# Patient Record
Sex: Male | Born: 1955 | Smoking: Former smoker
Health system: Southern US, Community
[De-identification: ages and names within clinical notes are randomized; demographics above are authoritative.]

## PROBLEM LIST (undated history)

## (undated) DIAGNOSIS — J4520 Mild intermittent asthma, uncomplicated: Secondary | ICD-10-CM

## (undated) DIAGNOSIS — M1990 Unspecified osteoarthritis, unspecified site: Secondary | ICD-10-CM

## (undated) DIAGNOSIS — I2510 Atherosclerotic heart disease of native coronary artery without angina pectoris: Secondary | ICD-10-CM

## (undated) DIAGNOSIS — L729 Follicular cyst of the skin and subcutaneous tissue, unspecified: Secondary | ICD-10-CM

## (undated) DIAGNOSIS — M25512 Pain in left shoulder: Secondary | ICD-10-CM

## (undated) DIAGNOSIS — G8929 Other chronic pain: Secondary | ICD-10-CM

## (undated) DIAGNOSIS — I219 Acute myocardial infarction, unspecified: Secondary | ICD-10-CM

## (undated) DIAGNOSIS — E785 Hyperlipidemia, unspecified: Secondary | ICD-10-CM

## (undated) HISTORY — PX: KNEE ARTHROSCOPY: SHX127

## (undated) HISTORY — DX: Hyperlipidemia, unspecified: E78.5

## (undated) HISTORY — DX: Atherosclerotic heart disease of native coronary artery without angina pectoris: I25.10

## (undated) HISTORY — DX: Other chronic pain: G89.29

## (undated) HISTORY — DX: Pain in left shoulder: M25.512

## (undated) HISTORY — PX: SKIN LESION EXCISION: SHX2412

## (undated) HISTORY — DX: Follicular cyst of the skin and subcutaneous tissue, unspecified: L72.9

## (undated) HISTORY — DX: Mild intermittent asthma, uncomplicated: J45.20

## (undated) HISTORY — PX: MOUTH SURGERY: SHX715

---

## 2012-03-28 HISTORY — PX: OTHER SURGICAL HISTORY: SHX169

## 2016-07-25 DIAGNOSIS — Z125 Encounter for screening for malignant neoplasm of prostate: Secondary | ICD-10-CM | POA: Diagnosis not present

## 2016-07-25 DIAGNOSIS — Z0000 Encounter for general adult medical examination without abnormal findings: Secondary | ICD-10-CM | POA: Diagnosis not present

## 2016-09-05 DIAGNOSIS — J329 Chronic sinusitis, unspecified: Secondary | ICD-10-CM | POA: Diagnosis not present

## 2016-09-05 DIAGNOSIS — J40 Bronchitis, not specified as acute or chronic: Secondary | ICD-10-CM | POA: Diagnosis not present

## 2016-10-04 ENCOUNTER — Other Ambulatory Visit: Payer: Self-pay

## 2016-10-04 DIAGNOSIS — M25512 Pain in left shoulder: Secondary | ICD-10-CM

## 2016-10-04 DIAGNOSIS — J4520 Mild intermittent asthma, uncomplicated: Secondary | ICD-10-CM

## 2016-10-04 DIAGNOSIS — E785 Hyperlipidemia, unspecified: Secondary | ICD-10-CM

## 2016-10-04 DIAGNOSIS — G8929 Other chronic pain: Secondary | ICD-10-CM

## 2016-10-04 DIAGNOSIS — I2510 Atherosclerotic heart disease of native coronary artery without angina pectoris: Secondary | ICD-10-CM

## 2016-10-04 DIAGNOSIS — L729 Follicular cyst of the skin and subcutaneous tissue, unspecified: Secondary | ICD-10-CM | POA: Insufficient documentation

## 2016-10-04 HISTORY — DX: Atherosclerotic heart disease of native coronary artery without angina pectoris: I25.10

## 2016-10-04 HISTORY — DX: Hyperlipidemia, unspecified: E78.5

## 2016-10-04 HISTORY — DX: Other chronic pain: G89.29

## 2016-10-04 HISTORY — DX: Follicular cyst of the skin and subcutaneous tissue, unspecified: L72.9

## 2016-10-04 HISTORY — DX: Mild intermittent asthma, uncomplicated: J45.20

## 2016-10-05 ENCOUNTER — Ambulatory Visit: Payer: 59 | Admitting: Cardiology

## 2016-10-05 DIAGNOSIS — M545 Low back pain: Secondary | ICD-10-CM | POA: Diagnosis not present

## 2016-10-05 DIAGNOSIS — M5416 Radiculopathy, lumbar region: Secondary | ICD-10-CM | POA: Diagnosis not present

## 2016-10-05 DIAGNOSIS — M47816 Spondylosis without myelopathy or radiculopathy, lumbar region: Secondary | ICD-10-CM | POA: Diagnosis not present

## 2016-12-15 DIAGNOSIS — R2689 Other abnormalities of gait and mobility: Secondary | ICD-10-CM | POA: Diagnosis not present

## 2016-12-15 DIAGNOSIS — M6281 Muscle weakness (generalized): Secondary | ICD-10-CM | POA: Diagnosis not present

## 2016-12-15 DIAGNOSIS — M545 Low back pain: Secondary | ICD-10-CM | POA: Diagnosis not present

## 2016-12-21 DIAGNOSIS — M6281 Muscle weakness (generalized): Secondary | ICD-10-CM | POA: Diagnosis not present

## 2016-12-21 DIAGNOSIS — M545 Low back pain: Secondary | ICD-10-CM | POA: Diagnosis not present

## 2016-12-21 DIAGNOSIS — R2689 Other abnormalities of gait and mobility: Secondary | ICD-10-CM | POA: Diagnosis not present

## 2016-12-28 DIAGNOSIS — M6281 Muscle weakness (generalized): Secondary | ICD-10-CM | POA: Diagnosis not present

## 2016-12-28 DIAGNOSIS — R2689 Other abnormalities of gait and mobility: Secondary | ICD-10-CM | POA: Diagnosis not present

## 2016-12-28 DIAGNOSIS — M545 Low back pain: Secondary | ICD-10-CM | POA: Diagnosis not present

## 2016-12-29 DIAGNOSIS — E789 Disorder of lipoprotein metabolism, unspecified: Secondary | ICD-10-CM | POA: Diagnosis not present

## 2016-12-29 DIAGNOSIS — R9431 Abnormal electrocardiogram [ECG] [EKG]: Secondary | ICD-10-CM | POA: Diagnosis not present

## 2016-12-29 DIAGNOSIS — I252 Old myocardial infarction: Secondary | ICD-10-CM | POA: Diagnosis not present

## 2016-12-29 DIAGNOSIS — I2510 Atherosclerotic heart disease of native coronary artery without angina pectoris: Secondary | ICD-10-CM | POA: Diagnosis not present

## 2017-01-05 DIAGNOSIS — M6281 Muscle weakness (generalized): Secondary | ICD-10-CM | POA: Diagnosis not present

## 2017-01-05 DIAGNOSIS — M545 Low back pain: Secondary | ICD-10-CM | POA: Diagnosis not present

## 2017-01-05 DIAGNOSIS — R2689 Other abnormalities of gait and mobility: Secondary | ICD-10-CM | POA: Diagnosis not present

## 2017-03-16 DIAGNOSIS — M6281 Muscle weakness (generalized): Secondary | ICD-10-CM | POA: Diagnosis not present

## 2017-03-16 DIAGNOSIS — R2689 Other abnormalities of gait and mobility: Secondary | ICD-10-CM | POA: Diagnosis not present

## 2017-03-16 DIAGNOSIS — M545 Low back pain: Secondary | ICD-10-CM | POA: Diagnosis not present

## 2017-03-23 DIAGNOSIS — M545 Low back pain: Secondary | ICD-10-CM | POA: Diagnosis not present

## 2017-03-23 DIAGNOSIS — M6281 Muscle weakness (generalized): Secondary | ICD-10-CM | POA: Diagnosis not present

## 2017-03-23 DIAGNOSIS — R2689 Other abnormalities of gait and mobility: Secondary | ICD-10-CM | POA: Diagnosis not present

## 2017-03-30 DIAGNOSIS — M6281 Muscle weakness (generalized): Secondary | ICD-10-CM | POA: Diagnosis not present

## 2017-03-30 DIAGNOSIS — R2689 Other abnormalities of gait and mobility: Secondary | ICD-10-CM | POA: Diagnosis not present

## 2017-03-30 DIAGNOSIS — M545 Low back pain: Secondary | ICD-10-CM | POA: Diagnosis not present

## 2017-04-06 DIAGNOSIS — R2689 Other abnormalities of gait and mobility: Secondary | ICD-10-CM | POA: Diagnosis not present

## 2017-04-06 DIAGNOSIS — M791 Myalgia, unspecified site: Secondary | ICD-10-CM | POA: Diagnosis not present

## 2017-04-06 DIAGNOSIS — M545 Low back pain: Secondary | ICD-10-CM | POA: Diagnosis not present

## 2017-04-06 DIAGNOSIS — M6281 Muscle weakness (generalized): Secondary | ICD-10-CM | POA: Diagnosis not present

## 2017-04-06 DIAGNOSIS — M2550 Pain in unspecified joint: Secondary | ICD-10-CM | POA: Diagnosis not present

## 2017-04-13 DIAGNOSIS — M6281 Muscle weakness (generalized): Secondary | ICD-10-CM | POA: Diagnosis not present

## 2017-04-13 DIAGNOSIS — R2689 Other abnormalities of gait and mobility: Secondary | ICD-10-CM | POA: Diagnosis not present

## 2017-04-13 DIAGNOSIS — M545 Low back pain: Secondary | ICD-10-CM | POA: Diagnosis not present

## 2017-08-04 DIAGNOSIS — Z0000 Encounter for general adult medical examination without abnormal findings: Secondary | ICD-10-CM | POA: Diagnosis not present

## 2017-08-04 DIAGNOSIS — E663 Overweight: Secondary | ICD-10-CM | POA: Diagnosis not present

## 2017-08-04 DIAGNOSIS — Z125 Encounter for screening for malignant neoplasm of prostate: Secondary | ICD-10-CM | POA: Diagnosis not present

## 2017-10-04 DIAGNOSIS — N390 Urinary tract infection, site not specified: Secondary | ICD-10-CM | POA: Diagnosis not present

## 2017-10-04 DIAGNOSIS — N138 Other obstructive and reflux uropathy: Secondary | ICD-10-CM | POA: Diagnosis not present

## 2018-02-12 DIAGNOSIS — I252 Old myocardial infarction: Secondary | ICD-10-CM | POA: Diagnosis not present

## 2018-02-12 DIAGNOSIS — E789 Disorder of lipoprotein metabolism, unspecified: Secondary | ICD-10-CM | POA: Diagnosis not present

## 2018-02-12 DIAGNOSIS — I2510 Atherosclerotic heart disease of native coronary artery without angina pectoris: Secondary | ICD-10-CM | POA: Diagnosis not present

## 2018-08-13 DIAGNOSIS — E789 Disorder of lipoprotein metabolism, unspecified: Secondary | ICD-10-CM | POA: Diagnosis not present

## 2018-08-13 DIAGNOSIS — Z955 Presence of coronary angioplasty implant and graft: Secondary | ICD-10-CM | POA: Diagnosis not present

## 2018-08-13 DIAGNOSIS — I252 Old myocardial infarction: Secondary | ICD-10-CM | POA: Diagnosis not present

## 2018-08-13 DIAGNOSIS — I2510 Atherosclerotic heart disease of native coronary artery without angina pectoris: Secondary | ICD-10-CM | POA: Diagnosis not present

## 2020-07-23 DIAGNOSIS — U071 COVID-19: Secondary | ICD-10-CM

## 2020-07-23 HISTORY — DX: COVID-19: U07.1

## 2020-09-18 ENCOUNTER — Ambulatory Visit: Payer: Self-pay | Admitting: Orthopedic Surgery

## 2020-09-29 ENCOUNTER — Ambulatory Visit: Payer: Self-pay | Admitting: Orthopedic Surgery

## 2020-09-29 NOTE — H&P (Unsigned)
Subjective:   Patient here for pre-op H&P. ACDF C3-C6 for cercical myelopathy on 10/08/20 at [facility] with Dr. Shon Baton  Patient Active Problem List   Diagnosis Date Noted   Asthma in adult, mild intermittent, uncomplicated 10/04/2016   Dyslipidemia 10/04/2016   CAD (coronary artery disease), native coronary artery 10/04/2016   Skin cyst 10/04/2016   Chronic left shoulder pain 10/04/2016   Past Medical History:  Diagnosis Date   Asthma in adult, mild intermittent, uncomplicated 10/04/2016   CAD (coronary artery disease), native coronary artery 10/04/2016   Without angina pectoris; s/p circumflex stent 2014   Chronic left shoulder pain 10/04/2016   Has had injections in the past.   Dyslipidemia 10/04/2016   Refused statin therapy in the past per PCP records   Skin cyst 10/04/2016   Multiple at various times. Has had some removed in the past.    *** The histories are not reviewed yet. Please review them in the "History" navigator section and refresh this SmartLink.  Current Outpatient Medications  Medication Sig Dispense Refill Last Dose   albuterol (VENTOLIN HFA) 108 (90 Base) MCG/ACT inhaler Inhale 1-2 puffs into the lungs every 6 (six) hours as needed for wheezing or shortness of breath.      Ascorbic Acid (VITAMIN C) 1000 MG tablet Take 3,000 mg by mouth in the morning and at bedtime.      aspirin EC 81 MG tablet Take 81 mg by mouth in the morning. Swallow whole.      Carboxymeth-Glyc-Polysorb PF (REFRESH OPTIVE MEGA-3) 0.5-1-0.5 % SOLN Place 1 drop into both eyes daily.      metoprolol succinate (TOPROL-XL) 25 MG 24 hr tablet Take 12.5 mg by mouth daily with breakfast.      Multiple Vitamin (MULTIVITAMIN WITH MINERALS) TABS tablet Take 1 tablet by mouth daily. Centrum Silver Men 50+      tamsulosin (FLOMAX) 0.4 MG CAPS capsule Take 0.4 mg by mouth at bedtime.      No current facility-administered medications for this visit.   No Known Allergies  Social History   Tobacco Use   Smoking  status: Former    Pack years: 0.00    Types: Cigarettes    Quit date: 2014    Years since quitting: 8.5   Smokeless tobacco: Not on file  Substance Use Topics   Alcohol use: Yes    Comment: occasional    Family History  Problem Relation Age of Onset   Hypercholesterolemia Father    Heart disease Father    Lung cancer Paternal Grandfather     Review of Systems A comprehensive review of systems was negative.  Objective:   Vitals: Ht: 5 ft 10 in 09/29/2020 09:48 am BP: 118/78 09/29/2020 09:50 am Pulse: 63 bpm 09/29/2020 09:49 am  Clinical exam: Elijah Thompson is a pleasant individual, who appears younger than their stated age. He is alert and orientated 3. No shortness of breath, chest pain.  Abdomen is soft and non-tender, negative loss of bowel and bladder control, no rebound tenderness. BSx4  Heart: Regular rate and rhythm, no rubs, murmurs, or gallops  Lungs: Clear auscultation bilaterally  Negative: skin lesions abrasions contusions Peripheral pulses: 2+ radial artery pulses bilaterally. Compartment soft and nontender. Gait pattern: Negative Romberg's test. Patient does describe subjective balance control issues. He is able to heel to ambulate. Assistive devices: Negative Neuro: 5/5 motor strength in the upper extremity bilaterally. Positive Babinski test, positive right radicular arm pain. Negative Hoffman test, no clonus, negative inverted brachioradialis reflex.  Positive dysesthesias in the upper and lower extremity. Musculoskeletal: Moderate to severe neck pain with palpation and range of motion. Cervical x-rays taken today in the office (AP/lateral) were reviewed: Demonstrate multilevel degenerative disease C3-T1. Positive loss of normal lordosis. No fracture is seen. Cervical MRI: completed on 09/01/20 was reviewed with the patient. It was completed at [facility]; I have independently reviewed the images as well as the radiology report. Significant degenerative cervical  spondylitic disease C3-6 with cord signal changes at C4-5. Severe spinal stenosis and foraminal stenosis C4-5. Severe bilateral neuroforaminal narrowing at C3-4 and C5-6. Severe left moderate to severe right C6-7 foraminal narrowing with some impingement on the left C7 nerve root. Mild neural foraminal narrowing at C7-T1.  Assessment:   Elijah Thompson returns today for follow-up with his wife to discuss surgical intervention for cervical spondylitic myelopathy. I have gone through his imaging studies with the patient and his wife and his clinical exam. There is been no change since his last office visit of 09/07/20. He continues to have functional deficits consistent with cervical spondylitic myelopathy. He has multilevel degenerative disease but most prominent issues C3-6. At this point time the goal of surgery is to prevent worsening of his myelopathy. All of his and his wife's questions were addressed. We have gone over the risks, benefits, alternatives to surgery.   Plan:   Risks and benefits of surgery were discussed with the patient. These include: Infection, bleeding, death, stroke, paralysis, ongoing or worse pain, need for additional surgery, nonunion, leak of spinal fluid, adjacent segment degeneration requiring additional fusion surgery. Pseudoarthrosis (nonunion)requiring supplemental posterior fixation. Throat pain, swallowing difficulties, hoarseness or change in voice.  With obtain preoperative medical clearance from the patient's primary care provider as well as his cardiologist.  I did review the patient's medication list. He would hold his aspirin 81 mg 1 week prior to surgery we will plan to restart this 48 hours after surgery  Preop testing skin edges come in on Monday. Fitting for Aspen collar scheduled for tomorrow.  We have also discussed the post-operative recovery period to include: bathing/showering restrictions, wound healing, activity (and driving) restrictions, medications/pain  mangement.  We have also discussed post-operative redflags to include: signs and symptoms of postoperative infection, DVT/PE.  Discharge instructions discussed with the patient and his wife. All questions were answered.  Follow-up: 2 weeks postop

## 2020-10-02 NOTE — Pre-Procedure Instructions (Signed)
Surgical Instructions    Your procedure is scheduled on Thursday, July 14th.  Report to [facility] Main Entrance "A" at 5:30 A.M., then check in with the Admitting office.  Call this number if you have problems the morning of surgery:  [phone]   If you have any questions prior to your surgery date call [phone]: Open Monday-Friday 8am-4pm    Remember:  Do not eat after midnight the night before your surgery  You may drink clear liquids until 4:30 a.m. the morning of your surgery.   Clear liquids allowed are: Water, Non-Citrus Juices (without pulp), Carbonated Beverages, Clear Tea, Black Coffee Only, and Gatorade.    Take these medicines the morning of surgery with A SIP OF WATER  metoprolol succinate (TOPROL-XL) Eye drops albuterol (VENTOLIN HFA) - as needed. Please bring with you to the hospital.  Follow your surgeon's instructions on when to stop Aspirin.  If no instructions were given by your surgeon then you will need to call the office to get those instructions.    As of today, STOP taking any Aspirin (unless otherwise instructed by your surgeon) Aleve, Naproxen, Ibuprofen, Motrin, Advil, Goody's, BC's, all herbal medications, fish oil, and all vitamins.                     Do NOT Smoke (Tobacco/Vaping) or drink Alcohol 24 hours prior to your procedure.  If you use a CPAP at night, you may bring all equipment for your overnight stay.   Contacts, glasses, piercing's, hearing aid's, dentures or partials may not be worn into surgery, please bring cases for these belongings.    For patients admitted to the hospital, discharge time will be determined by your treatment team.   Patients discharged the day of surgery will not be allowed to drive home, and someone needs to stay with them for 24 hours.  ONLY 1 SUPPORT PERSON MAY BE PRESENT WHILE YOU ARE IN SURGERY. IF YOU ARE TO BE ADMITTED ONCE YOU ARE IN YOUR ROOM YOU WILL BE ALLOWED TWO (2) VISITORS.  Minor children may  have two parents present. Special consideration for safety and communication needs will be reviewed on a case by case basis.   Special instructions:   [facility]- Preparing For Surgery  Before surgery, you can play an important role. Because skin is not sterile, your skin needs to be as free of germs as possible. You can reduce the number of germs on your skin by washing with CHG (chlorahexidine gluconate) Soap before surgery.  CHG is an antiseptic cleaner which kills germs and bonds with the skin to continue killing germs even after washing.    Oral Hygiene is also important to reduce your risk of infection.  Remember - BRUSH YOUR TEETH THE MORNING OF SURGERY WITH YOUR REGULAR TOOTHPASTE  Please do not use if you have an allergy to CHG or antibacterial soaps. If your skin becomes reddened/irritated stop using the CHG.  Do not shave (including legs and underarms) for at least 48 hours prior to first CHG shower. It is OK to shave your face.  Please follow these instructions carefully.   Shower the NIGHT BEFORE SURGERY and the MORNING OF SURGERY  If you chose to wash your hair, wash your hair first as usual with your normal shampoo.  After you shampoo, rinse your hair and body thoroughly to remove the shampoo.  Use CHG Soap as you would any other liquid soap. You can apply CHG directly to the  skin and wash gently with a scrungie or a clean washcloth.   Apply the CHG Soap to your body ONLY FROM THE NECK DOWN.  Do not use on open wounds or open sores. Avoid contact with your eyes, ears, mouth and genitals (private parts). Wash Face and genitals (private parts)  with your normal soap.   Wash thoroughly, paying special attention to the area where your surgery will be performed.  Thoroughly rinse your body with warm water from the neck down.  DO NOT shower/wash with your normal soap after using and rinsing off the CHG Soap.  Pat yourself dry with a CLEAN TOWEL.  Wear CLEAN PAJAMAS to bed  the night before surgery  Place CLEAN SHEETS on your bed the night before your surgery  DO NOT SLEEP WITH PETS.   Day of Surgery: Shower with CHG soap. Do not wear jewelry. Do not wear lotions, powders, colognes, or deodorant. Men may shave face and neck. Do not bring valuables to the hospital. [facility] is not responsible for any belongings or valuables. Wear Clean/Comfortable clothing the morning of surgery Remember to brush your teeth WITH YOUR REGULAR TOOTHPASTE.   Please read over the following fact sheets that you were given.

## 2020-10-05 ENCOUNTER — Other Ambulatory Visit: Payer: Self-pay

## 2020-10-05 ENCOUNTER — Encounter (HOSPITAL_COMMUNITY)
Admission: RE | Admit: 2020-10-05 | Discharge: 2020-10-05 | Disposition: A | Discharge status: Home / Self Care | Payer: 59 | Source: Ambulatory Visit | Attending: Orthopedic Surgery | Admitting: Orthopedic Surgery

## 2020-10-05 ENCOUNTER — Ambulatory Visit (HOSPITAL_COMMUNITY)
Admission: RE | Admit: 2020-10-05 | Discharge: 2020-10-05 | Disposition: A | Discharge status: Home / Self Care | Payer: 59 | Source: Ambulatory Visit | Attending: Orthopedic Surgery | Admitting: Orthopedic Surgery

## 2020-10-05 ENCOUNTER — Encounter (HOSPITAL_COMMUNITY): Payer: Self-pay

## 2020-10-05 DIAGNOSIS — I252 Old myocardial infarction: Secondary | ICD-10-CM | POA: Diagnosis not present

## 2020-10-05 DIAGNOSIS — Z01811 Encounter for preprocedural respiratory examination: Secondary | ICD-10-CM

## 2020-10-05 DIAGNOSIS — Z20822 Contact with and (suspected) exposure to covid-19: Secondary | ICD-10-CM | POA: Diagnosis not present

## 2020-10-05 DIAGNOSIS — I2510 Atherosclerotic heart disease of native coronary artery without angina pectoris: Secondary | ICD-10-CM | POA: Insufficient documentation

## 2020-10-05 DIAGNOSIS — J45909 Unspecified asthma, uncomplicated: Secondary | ICD-10-CM | POA: Diagnosis not present

## 2020-10-05 DIAGNOSIS — Z01818 Encounter for other preprocedural examination: Secondary | ICD-10-CM | POA: Diagnosis not present

## 2020-10-05 HISTORY — DX: Acute myocardial infarction, unspecified: I21.9

## 2020-10-05 HISTORY — DX: Unspecified osteoarthritis, unspecified site: M19.90

## 2020-10-05 LAB — CBC
HCT: 49.9 % (ref 39.0–52.0)
Hemoglobin: 16.5 g/dL (ref 13.0–17.0)
MCH: 31.1 pg (ref 26.0–34.0)
MCHC: 33.1 g/dL (ref 30.0–36.0)
MCV: 94 fL (ref 80.0–100.0)
Platelets: 211 10*3/uL (ref 150–400)
RBC: 5.31 MIL/uL (ref 4.22–5.81)
RDW: 13.2 % (ref 11.5–15.5)
WBC: 6.6 10*3/uL (ref 4.0–10.5)
nRBC: 0 % (ref 0.0–0.2)

## 2020-10-05 LAB — TYPE AND SCREEN
ABO/RH(D): A POS
Antibody Screen: NEGATIVE

## 2020-10-05 LAB — URINALYSIS, ROUTINE W REFLEX MICROSCOPIC
Bacteria, UA: NONE SEEN
Bilirubin Urine: NEGATIVE
Glucose, UA: NEGATIVE mg/dL
Hgb urine dipstick: NEGATIVE
Ketones, ur: NEGATIVE mg/dL
Nitrite: NEGATIVE
Protein, ur: NEGATIVE mg/dL
Specific Gravity, Urine: 1.008 (ref 1.005–1.030)
pH: 7 (ref 5.0–8.0)

## 2020-10-05 LAB — SURGICAL PCR SCREEN
MRSA, PCR: NEGATIVE
Staphylococcus aureus: POSITIVE — AB

## 2020-10-05 LAB — PROTIME-INR
INR: 1 (ref 0.8–1.2)
Prothrombin Time: 12.8 seconds (ref 11.4–15.2)

## 2020-10-05 LAB — BASIC METABOLIC PANEL
Anion gap: 6 (ref 5–15)
BUN: 7 mg/dL — ABNORMAL LOW (ref 8–23)
CO2: 28 mmol/L (ref 22–32)
Calcium: 9.5 mg/dL (ref 8.9–10.3)
Chloride: 102 mmol/L (ref 98–111)
Creatinine, Ser: 0.81 mg/dL (ref 0.61–1.24)
GFR, Estimated: >60 mL/min (ref >60)
Glucose, Bld: 98 mg/dL (ref 70–99)
Potassium: 4.2 mmol/L (ref 3.5–5.1)
Sodium: 136 mmol/L (ref 135–145)

## 2020-10-05 LAB — APTT: aPTT: 27 seconds (ref 24–36)

## 2020-10-05 NOTE — Progress Notes (Signed)
PCP - Dr. Maryjean Ka Cardiologist - Dr. Vernona Rieger  Chest x-ray - 10/05/20 EKG - 10/05/20 Stress Test - 08/20/20-[facility] ECHO - 08/20/20-[facility] Cardiac Cath - 02/22/13-[facility], Dr. Aleda Grana. Records requested.  Sleep Study - denies CPAP - denies  Blood Thinner Instructions:n/a Aspirin Instructions: LD 7/6  ERAS Protcol -Clear liquids until 0430 per anesthesia protocol. PRE-SURGERY Ensure or G2- none ordered  COVID TEST- 10/05/20 done in PAT  Anesthesia review: Yes, per order. Pending cardiac records.    Patient denies shortness of breath, fever, cough and chest pain at PAT appointment   All instructions explained to the patient, with a verbal understanding of the material. Patient agrees to go over the instructions while at home for a better understanding. Patient also instructed to self quarantine after being tested for COVID-19. The opportunity to ask questions was provided.

## 2020-10-06 LAB — SARS CORONAVIRUS 2 (TAT 6-24 HRS): SARS Coronavirus 2: POSITIVE — AB

## 2020-10-06 NOTE — Progress Notes (Addendum)
Anesthesia Chart Review:  Case: [id number] Date/Time: 10/08/20 0715   Procedure: ANTERIOR CERVICAL DECOMPRESSION/DISCECTOMY FUSION 3 LEVELS  C3-6 - 4.5 hrs   Anesthesia type: General   Pre-op diagnosis: Cervical myelopathy with degenerative disc disease   [facility] ROOM 04 / [facility] OR   Surgeons: Venita Lick, MD       DISCUSSION: Patient is a 65 year old male scheduled for the above procedure.  History includes former smoker (quit 03/28/12), CAD (NSTEMI, s/p DES CX 02/22/13), dyslipidemia, asthma.   Last evaluation by cardiologist Dr. Judithe Modest on 06/29/20. Stress echo ordered for dyspnea. Result was non-ischemic but at subtarget HR. Last ASA 09/30/20.  Dr. Shon Baton did get medical and cardiology clearances for this procedure. (Copies on shadow chart.)   10/05/20 presurgical COVID-19 test positive. He denied SOB, cough, fever, chest pain at PAT RN visit. I have notified Cordelia Pen at Dr. Shon Baton' office, she contacted patient. He remains asymptomatic from any COVID-type symptoms, but also reported previously positive COVID test within the past 90 days at [facility]. Test was on 07/23/20 (copy on shadow chart). Given previously positive test within 90 days in an asymptomatic patient, it appears current positive test from 10/05/20 is not related to acute disease. If no acute changes then would anticipate that he can proceed as planned. I have communicated with Holding RN Flow Coordinator.   VS: BP (!) 153/93   Pulse 64   Temp 36.9 C (Oral)   Resp 18   Ht 5\' 10"  (1.778 m)   Wt 87.5 kg   SpO2 99%   BMI 27.69 kg/m    PROVIDERS: Street, , MD is PCP Corporate treasurer, MD is cardiologist   LABS: Preoperative labs noted (all labs ordered are listed, but only abnormal results are displayed)  Labs Reviewed  SURGICAL PCR SCREEN - Abnormal; Notable for the following components:      Result Value   Staphylococcus aureus POSITIVE (*)    All other components within normal limits  SARS  CORONAVIRUS 2 (TAT 6-24 HRS) - Abnormal; Notable for the following components:   SARS Coronavirus 2 POSITIVE (*)    All other components within normal limits  BASIC METABOLIC PANEL - Abnormal; Notable for the following components:   BUN 7 (*)    All other components within normal limits  URINALYSIS, ROUTINE W REFLEX MICROSCOPIC - Abnormal; Notable for the following components:   Leukocytes,Ua TRACE (*)    All other components within normal limits  CBC  PROTIME-INR  APTT  TYPE AND SCREEN    IMAGES: CXR 10/05/20: FINDINGS: The heart size and mediastinal contours are within normal limits. Both lungs are clear. The visualized skeletal structures are unremarkable.  IMPRESSION: Normal study.   EKG: 10/05/20: Sinus bradycardia at 55 bpm Minimal voltage criteria for LVH, may be normal variant ( Cornell product ) Abnormal ECG Confirmed by 12/06/20 [phone]) on 10/05/2020 7:38:05 PM   CV: Stress echo 08/20/20 ([facility]): Normal left ventricular function and global wall motion with stress.  PSVC + PVC. Nondiagnostic exercise echocardiography due to subtarget  heart rate. Negative exercise echocardiography for inducible ischemia  at subtarget heart rate achieved.   Cardiac cath 02/22/13 [facility], Dr. [facility]):  LVEDP 16. LV systolic function normal, EF approximately 60%.  No significant MR. RCA dominant, no obstructive disease. Left main within normal limits. LAD artery producing moderate size diagonal artery.  No significant disease in LAD or diagonal. Left circumflex artery with proximal 95% stenosis  before a large branching obtuse marginal vessel.  Clearly this is the culprit vessel.  S/p Xience drug eluding stent with 0% residual stenosis with TIMI grade III flow.   Past Medical History:  Diagnosis Date   Arthritis    Asthma in adult, mild intermittent, uncomplicated 10/04/2016   CAD (coronary artery disease), native coronary artery 10/04/2016    Without angina pectoris; s/p circumflex stent 2014   Chronic left shoulder pain 10/04/2016   Has had injections in the past.   Dyslipidemia 10/04/2016   Refused statin therapy in the past per PCP records   Myocardial infarction [facility])    Skin cyst 10/04/2016   Multiple at various times. Has had some removed in the past.    Past Surgical History:  Procedure Laterality Date   KNEE ARTHROSCOPY Left    MOUTH SURGERY     Wisdom tooth removal   SKIN LESION EXCISION     Local excision   Vascular stent left circumflex Left 2014    MEDICATIONS:  albuterol (VENTOLIN HFA) 108 (90 Base) MCG/ACT inhaler   Ascorbic Acid (VITAMIN C) 1000 MG tablet   aspirin EC 81 MG tablet   Carboxymeth-Glyc-Polysorb PF (REFRESH OPTIVE MEGA-3) 0.5-1-0.5 % SOLN   metoprolol succinate (TOPROL-XL) 25 MG 24 hr tablet   Multiple Vitamin (MULTIVITAMIN WITH MINERALS) TABS tablet   tamsulosin (FLOMAX) 0.4 MG CAPS capsule   No current facility-administered medications for this encounter.    Shonna Chock, PA-C Surgical Short Stay/Anesthesiology [facility] Phone [phone] [facility] Phone [phone] 10/07/2020 10:24 AM

## 2020-10-07 ENCOUNTER — Encounter (HOSPITAL_COMMUNITY): Payer: Self-pay

## 2020-10-07 NOTE — Anesthesia Preprocedure Evaluation (Addendum)
Anesthesia Evaluation  Patient identified by MRN, date of birth, ID band Patient awake    Reviewed: Allergy & Precautions, NPO status , Patient's Chart, lab work & pertinent test results  History of Anesthesia Complications Negative for: history of anesthetic complications  Airway Mallampati: II  TM Distance: >3 FB Neck ROM: Limited    Dental  (+) Dental Advisory Given, Teeth Intact   Pulmonary asthma , former smoker,    Pulmonary exam normal        Cardiovascular + CAD, + Past MI and + Cardiac Stents  Normal cardiovascular exam     Neuro/Psych negative neurological ROS  negative psych ROS   GI/Hepatic negative GI ROS, Neg liver ROS,   Endo/Other  negative endocrine ROS  Renal/GU negative Renal ROS     Musculoskeletal  (+) Arthritis ,   Abdominal   Peds  Hematology negative hematology ROS (+)   Anesthesia Other Findings Covid+ 07/23/20, repeat test also + 10/05/20    Reproductive/Obstetrics                           Anesthesia Physical Anesthesia Plan  ASA: 3  Anesthesia Plan: General   Post-op Pain Management:    Induction: Intravenous  PONV Risk Score and Plan: 3 and Treatment may vary due to age or medical condition, Ondansetron, Dexamethasone and Midazolam  Airway Management Planned: Oral ETT and Video Laryngoscope Planned  Additional Equipment: None  Intra-op Plan:   Post-operative Plan: Extubation in OR  Informed Consent: I have reviewed the patients History and Physical, chart, labs and discussed the procedure including the risks, benefits and alternatives for the proposed anesthesia with the patient or authorized representative who has indicated his/her understanding and acceptance.     Dental advisory given  Plan Discussed with: CRNA and Anesthesiologist  Anesthesia Plan Comments:       Anesthesia Quick Evaluation

## 2020-10-08 ENCOUNTER — Other Ambulatory Visit: Payer: Self-pay

## 2020-10-08 ENCOUNTER — Encounter (HOSPITAL_COMMUNITY): Payer: Self-pay | Admitting: Orthopedic Surgery

## 2020-10-08 ENCOUNTER — Ambulatory Visit (HOSPITAL_COMMUNITY)
Admission: RE | Disposition: A | Discharge status: Still In Hospital | Payer: Self-pay | Source: Home / Self Care | Attending: Orthopedic Surgery

## 2020-10-08 ENCOUNTER — Ambulatory Visit (HOSPITAL_COMMUNITY): Payer: 59 | Admitting: Physician Assistant

## 2020-10-08 ENCOUNTER — Ambulatory Visit (HOSPITAL_COMMUNITY): Payer: 59 | Admitting: Anesthesiology

## 2020-10-08 ENCOUNTER — Ambulatory Visit (HOSPITAL_COMMUNITY): Payer: 59

## 2020-10-08 ENCOUNTER — Observation Stay (HOSPITAL_COMMUNITY)
Admission: RE | Admit: 2020-10-08 | Discharge: 2020-10-09 | Disposition: A | Discharge status: Still In Hospital | Payer: 59 | Attending: Orthopedic Surgery | Admitting: Orthopedic Surgery

## 2020-10-08 DIAGNOSIS — Z79899 Other long term (current) drug therapy: Secondary | ICD-10-CM | POA: Diagnosis not present

## 2020-10-08 DIAGNOSIS — J45909 Unspecified asthma, uncomplicated: Secondary | ICD-10-CM | POA: Insufficient documentation

## 2020-10-08 DIAGNOSIS — I2510 Atherosclerotic heart disease of native coronary artery without angina pectoris: Secondary | ICD-10-CM | POA: Insufficient documentation

## 2020-10-08 DIAGNOSIS — M4322 Fusion of spine, cervical region: Secondary | ICD-10-CM | POA: Diagnosis present

## 2020-10-08 DIAGNOSIS — Z419 Encounter for procedure for purposes other than remedying health state, unspecified: Secondary | ICD-10-CM

## 2020-10-08 DIAGNOSIS — Z8616 Personal history of COVID-19: Secondary | ICD-10-CM | POA: Insufficient documentation

## 2020-10-08 DIAGNOSIS — Z7982 Long term (current) use of aspirin: Secondary | ICD-10-CM | POA: Insufficient documentation

## 2020-10-08 DIAGNOSIS — M50022 Cervical disc disorder at C5-C6 level with myelopathy: Secondary | ICD-10-CM | POA: Diagnosis not present

## 2020-10-08 HISTORY — PX: ANTERIOR CERVICAL DECOMP/DISCECTOMY FUSION: SHX1161

## 2020-10-08 LAB — ABO/RH: ABO/RH(D): A POS

## 2020-10-08 SURGERY — ANTERIOR CERVICAL DECOMPRESSION/DISCECTOMY FUSION 3 LEVELS
Anesthesia: General | Site: Spine Cervical

## 2020-10-08 MED ORDER — ONDANSETRON HCL 4 MG/2ML IJ SOLN
INTRAMUSCULAR | Status: AC
  Filled 2020-10-08: qty 2

## 2020-10-08 MED ORDER — ACETAMINOPHEN 325 MG PO TABS
650.0000 mg | ORAL_TABLET | ORAL | Status: DC | PRN
  Administered 2020-10-08: 650 mg via ORAL
  Filled 2020-10-08: qty 2

## 2020-10-08 MED ORDER — CEFAZOLIN SODIUM-DEXTROSE 1-4 GM/50ML-% IV SOLN
1.0000 g | Freq: Three times a day (TID) | INTRAVENOUS | Status: AC
  Administered 2020-10-08 (×2): 1 g via INTRAVENOUS
  Filled 2020-10-08 (×2): qty 50

## 2020-10-08 MED ORDER — PROMETHAZINE HCL 25 MG/ML IJ SOLN: 6.2500 mg | INTRAMUSCULAR | Status: DC | PRN

## 2020-10-08 MED ORDER — SODIUM CHLORIDE 0.9% FLUSH
3.0000 mL | Freq: Two times a day (BID) | INTRAVENOUS | Status: DC
  Administered 2020-10-08 (×2): 3 mL via INTRAVENOUS

## 2020-10-08 MED ORDER — METHOCARBAMOL 500 MG PO TABS
500.0000 mg | ORAL_TABLET | Freq: Four times a day (QID) | ORAL | Status: DC | PRN
  Administered 2020-10-08 (×2): 500 mg via ORAL
  Filled 2020-10-08: qty 1

## 2020-10-08 MED ORDER — LIDOCAINE HCL (CARDIAC) PF 100 MG/5ML IV SOSY
PREFILLED_SYRINGE | INTRAVENOUS | Status: DC | PRN
  Administered 2020-10-08: 60 mg via INTRAVENOUS

## 2020-10-08 MED ORDER — SUCCINYLCHOLINE CHLORIDE 200 MG/10ML IV SOSY
PREFILLED_SYRINGE | INTRAVENOUS | Status: AC
  Filled 2020-10-08: qty 10

## 2020-10-08 MED ORDER — ONDANSETRON HCL 4 MG/2ML IJ SOLN
INTRAMUSCULAR | Status: DC | PRN
  Administered 2020-10-08: 4 mg via INTRAVENOUS

## 2020-10-08 MED ORDER — ONDANSETRON HCL 4 MG PO TABS: 4.0000 mg | ORAL_TABLET | Freq: Three times a day (TID) | ORAL | 0 refills | Status: AC | PRN

## 2020-10-08 MED ORDER — ONDANSETRON HCL 4 MG/2ML IJ SOLN: 4.0000 mg | Freq: Four times a day (QID) | INTRAMUSCULAR | Status: DC | PRN

## 2020-10-08 MED ORDER — MIDAZOLAM HCL 2 MG/2ML IJ SOLN
INTRAMUSCULAR | Status: AC
  Filled 2020-10-08: qty 2

## 2020-10-08 MED ORDER — PHENYLEPHRINE 40 MCG/ML (10ML) SYRINGE FOR IV PUSH (FOR BLOOD PRESSURE SUPPORT)
PREFILLED_SYRINGE | INTRAVENOUS | Status: AC
  Filled 2020-10-08: qty 10

## 2020-10-08 MED ORDER — CEFAZOLIN SODIUM-DEXTROSE 2-4 GM/100ML-% IV SOLN
INTRAVENOUS | Status: AC
  Filled 2020-10-08: qty 100

## 2020-10-08 MED ORDER — DEXAMETHASONE SODIUM PHOSPHATE 4 MG/ML IJ SOLN: 4.0000 mg | Freq: Four times a day (QID) | INTRAMUSCULAR | Status: DC

## 2020-10-08 MED ORDER — METOPROLOL SUCCINATE ER 25 MG PO TB24
12.5000 mg | ORAL_TABLET | Freq: Every day | ORAL | Status: DC
  Administered 2020-10-09: 12.5 mg via ORAL
  Filled 2020-10-08: qty 1

## 2020-10-08 MED ORDER — OXYCODONE-ACETAMINOPHEN 10-325 MG PO TABS: 1.0000 | ORAL_TABLET | Freq: Four times a day (QID) | ORAL | 0 refills | Status: AC | PRN

## 2020-10-08 MED ORDER — THROMBIN 20000 UNITS EX SOLR
CUTANEOUS | Status: DC | PRN
  Administered 2020-10-08: 20 mL

## 2020-10-08 MED ORDER — FENTANYL CITRATE (PF) 100 MCG/2ML IJ SOLN
INTRAMUSCULAR | Status: AC
  Filled 2020-10-08: qty 2

## 2020-10-08 MED ORDER — SODIUM CHLORIDE 0.9% FLUSH: 3.0000 mL | INTRAVENOUS | Status: DC | PRN

## 2020-10-08 MED ORDER — MENTHOL 3 MG MT LOZG
1.0000 | LOZENGE | OROMUCOSAL | Status: DC | PRN
  Filled 2020-10-08 (×3): qty 9

## 2020-10-08 MED ORDER — POLYETHYLENE GLYCOL 3350 17 G PO PACK
17.0000 g | PACK | Freq: Every day | ORAL | Status: DC | PRN
  Administered 2020-10-08: 17 g via ORAL
  Filled 2020-10-08: qty 1

## 2020-10-08 MED ORDER — ALBUTEROL SULFATE HFA 108 (90 BASE) MCG/ACT IN AERS
1.0000 | INHALATION_SPRAY | Freq: Four times a day (QID) | RESPIRATORY_TRACT | Status: DC | PRN
Start: 2020-10-08 — End: 2020-10-08

## 2020-10-08 MED ORDER — METHOCARBAMOL 500 MG PO TABS
ORAL_TABLET | ORAL | Status: AC
  Filled 2020-10-08: qty 1

## 2020-10-08 MED ORDER — PROPOFOL 10 MG/ML IV BOLUS
INTRAVENOUS | Status: DC | PRN
  Administered 2020-10-08: 160 mg via INTRAVENOUS

## 2020-10-08 MED ORDER — DEXAMETHASONE SODIUM PHOSPHATE 10 MG/ML IJ SOLN
INTRAMUSCULAR | Status: AC
  Filled 2020-10-08: qty 1

## 2020-10-08 MED ORDER — BUPIVACAINE-EPINEPHRINE 0.25% -1:200000 IJ SOLN
INTRAMUSCULAR | Status: DC | PRN
  Administered 2020-10-08: 10 mL

## 2020-10-08 MED ORDER — NALOXONE HCL 0.4 MG/ML IJ SOLN
INTRAMUSCULAR | Status: DC | PRN
  Administered 2020-10-08: .2 mg via INTRAVENOUS

## 2020-10-08 MED ORDER — PROPOFOL 1000 MG/100ML IV EMUL
INTRAVENOUS | Status: AC
  Filled 2020-10-08: qty 100

## 2020-10-08 MED ORDER — METHOCARBAMOL 500 MG PO TABS: 500.0000 mg | ORAL_TABLET | Freq: Three times a day (TID) | ORAL | 0 refills | Status: AC | PRN

## 2020-10-08 MED ORDER — DOCUSATE SODIUM 100 MG PO CAPS
100.0000 mg | ORAL_CAPSULE | Freq: Two times a day (BID) | ORAL | Status: DC
  Administered 2020-10-08 (×2): 100 mg via ORAL
  Filled 2020-10-08: qty 1

## 2020-10-08 MED ORDER — PHENOL 1.4 % MT LIQD
1.0000 | OROMUCOSAL | Status: DC | PRN
  Filled 2020-10-08: qty 177

## 2020-10-08 MED ORDER — THROMBIN (RECOMBINANT) 20000 UNITS EX SOLR
CUTANEOUS | Status: AC
  Filled 2020-10-08: qty 20000

## 2020-10-08 MED ORDER — EPHEDRINE SULFATE 50 MG/ML IJ SOLN
INTRAMUSCULAR | Status: DC | PRN
  Administered 2020-10-08: 10 mg via INTRAVENOUS

## 2020-10-08 MED ORDER — METHOCARBAMOL 1000 MG/10ML IJ SOLN
500.0000 mg | Freq: Four times a day (QID) | INTRAVENOUS | Status: DC | PRN
  Filled 2020-10-08: qty 5

## 2020-10-08 MED ORDER — OXYCODONE HCL 5 MG PO TABS
5.0000 mg | ORAL_TABLET | Freq: Once | ORAL | Status: DC | PRN
Start: 2020-10-08 — End: 2020-10-08

## 2020-10-08 MED ORDER — OXYCODONE HCL 5 MG PO TABS
5.0000 mg | ORAL_TABLET | ORAL | Status: DC | PRN
  Administered 2020-10-08: 5 mg via ORAL

## 2020-10-08 MED ORDER — OXYCODONE HCL 5 MG PO TABS
ORAL_TABLET | ORAL | Status: AC
  Filled 2020-10-08: qty 1

## 2020-10-08 MED ORDER — GABAPENTIN 300 MG PO CAPS
300.0000 mg | ORAL_CAPSULE | Freq: Three times a day (TID) | ORAL | Status: DC
  Administered 2020-10-08 (×2): 300 mg via ORAL
  Filled 2020-10-08 (×2): qty 1

## 2020-10-08 MED ORDER — FENTANYL CITRATE (PF) 100 MCG/2ML IJ SOLN
25.0000 ug | INTRAMUSCULAR | Status: DC | PRN
  Administered 2020-10-08 (×2): 50 ug via INTRAVENOUS

## 2020-10-08 MED ORDER — SUCCINYLCHOLINE CHLORIDE 200 MG/10ML IV SOSY
PREFILLED_SYRINGE | INTRAVENOUS | Status: DC | PRN
  Administered 2020-10-08: 160 mg via INTRAVENOUS

## 2020-10-08 MED ORDER — ACETAMINOPHEN 650 MG RE SUPP: 650.0000 mg | RECTAL | Status: DC | PRN

## 2020-10-08 MED ORDER — 0.9 % SODIUM CHLORIDE (POUR BTL) OPTIME
TOPICAL | Status: DC | PRN
  Administered 2020-10-08: 1000 mL

## 2020-10-08 MED ORDER — FENTANYL CITRATE (PF) 250 MCG/5ML IJ SOLN
INTRAMUSCULAR | Status: AC
  Filled 2020-10-08: qty 5

## 2020-10-08 MED ORDER — DEXAMETHASONE SODIUM PHOSPHATE 10 MG/ML IJ SOLN
INTRAMUSCULAR | Status: DC | PRN
  Administered 2020-10-08: 10 mg via INTRAVENOUS

## 2020-10-08 MED ORDER — ONDANSETRON HCL 4 MG PO TABS: 4.0000 mg | ORAL_TABLET | Freq: Four times a day (QID) | ORAL | Status: DC | PRN

## 2020-10-08 MED ORDER — OXYCODONE HCL 5 MG/5ML PO SOLN
5.0000 mg | Freq: Once | ORAL | Status: DC | PRN
Start: 2020-10-08 — End: 2020-10-08

## 2020-10-08 MED ORDER — TAMSULOSIN HCL 0.4 MG PO CAPS
0.4000 mg | ORAL_CAPSULE | Freq: Every day | ORAL | Status: DC
  Administered 2020-10-08: 0.4 mg via ORAL
  Filled 2020-10-08: qty 1

## 2020-10-08 MED ORDER — MAGNESIUM CITRATE PO SOLN: 1.0000 | Freq: Once | ORAL | Status: DC | PRN

## 2020-10-08 MED ORDER — HEMOSTATIC AGENTS (NO CHARGE) OPTIME
TOPICAL | Status: DC | PRN
  Administered 2020-10-08 (×2): 1

## 2020-10-08 MED ORDER — CEFAZOLIN SODIUM-DEXTROSE 2-3 GM-%(50ML) IV SOLR
INTRAVENOUS | Status: DC | PRN
  Administered 2020-10-08: 2 g via INTRAVENOUS

## 2020-10-08 MED ORDER — LACTATED RINGERS IV SOLN: INTRAVENOUS | Status: DC | PRN

## 2020-10-08 MED ORDER — NALOXONE HCL 0.4 MG/ML IJ SOLN
INTRAMUSCULAR | Status: AC
  Filled 2020-10-08: qty 1

## 2020-10-08 MED ORDER — PROPOFOL 500 MG/50ML IV EMUL
INTRAVENOUS | Status: DC | PRN
  Administered 2020-10-08: 50 ug/kg/min via INTRAVENOUS

## 2020-10-08 MED ORDER — PROPOFOL 1000 MG/100ML IV EMUL
INTRAVENOUS | Status: AC
  Filled 2020-10-08: qty 200

## 2020-10-08 MED ORDER — PROPOFOL 10 MG/ML IV BOLUS
INTRAVENOUS | Status: AC
  Filled 2020-10-08: qty 20

## 2020-10-08 MED ORDER — MIDAZOLAM HCL 5 MG/5ML IJ SOLN
INTRAMUSCULAR | Status: DC | PRN
  Administered 2020-10-08: 2 mg via INTRAVENOUS

## 2020-10-08 MED ORDER — BUPIVACAINE-EPINEPHRINE (PF) 0.25% -1:200000 IJ SOLN
INTRAMUSCULAR | Status: AC
  Filled 2020-10-08: qty 30

## 2020-10-08 MED ORDER — FENTANYL CITRATE (PF) 100 MCG/2ML IJ SOLN
INTRAMUSCULAR | Status: DC | PRN
  Administered 2020-10-08: 100 ug via INTRAVENOUS
  Administered 2020-10-08: 50 ug via INTRAVENOUS
  Administered 2020-10-08 (×4): 100 ug via INTRAVENOUS

## 2020-10-08 MED ORDER — OXYCODONE HCL 5 MG PO TABS
10.0000 mg | ORAL_TABLET | ORAL | Status: DC | PRN
  Administered 2020-10-08 – 2020-10-09 (×3): 10 mg via ORAL
  Filled 2020-10-08 (×3): qty 2

## 2020-10-08 MED ORDER — CHLORHEXIDINE GLUCONATE 0.12 % MT SOLN
OROMUCOSAL | Status: AC
  Administered 2020-10-08: 15 mL
  Filled 2020-10-08: qty 15

## 2020-10-08 MED ORDER — DEXAMETHASONE 4 MG PO TABS
4.0000 mg | ORAL_TABLET | Freq: Four times a day (QID) | ORAL | Status: DC
  Administered 2020-10-08 – 2020-10-09 (×3): 4 mg via ORAL
  Filled 2020-10-08 (×3): qty 1

## 2020-10-08 MED ORDER — SODIUM CHLORIDE 0.9 % IV SOLN: 250.0000 mL | INTRAVENOUS | Status: DC

## 2020-10-08 MED ORDER — LACTATED RINGERS IV SOLN: INTRAVENOUS | Status: DC

## 2020-10-08 SURGICAL SUPPLY — 69 items
AGENT HMST KT MTR STRL THRMB (HEMOSTASIS)
BAG COUNTER SPONGE SURGICOUNT (BAG) IMPLANT
BAG SPNG CNTER NS LX DISP (BAG)
BLADE CLIPPER SURG (BLADE) IMPLANT
BUR EGG ELITE 4.0 (BURR) IMPLANT
BUR MATCHSTICK NEURO 3.0 LAGG (BURR) IMPLANT
CABLE BIPOLOR RESECTION CORD (MISCELLANEOUS) ×2 IMPLANT
CAGE SPNL 6D 14XMED 16X7X (Cage) ×2 IMPLANT
CANISTER SUCT 3000ML PPV (MISCELLANEOUS) ×2 IMPLANT
CLSR STERI-STRIP ANTIMIC 1/2X4 (GAUZE/BANDAGES/DRESSINGS) ×2 IMPLANT
COVER MAYO STAND STRL (DRAPES) ×6 IMPLANT
COVER PLATE (Plate) ×3 IMPLANT
COVER SURGICAL LIGHT HANDLE (MISCELLANEOUS) ×4 IMPLANT
DEVICE FUSION NANLCK 8MM 6DEG (Cage) ×1 IMPLANT
DRAPE C-ARM 42X72 X-RAY (DRAPES) ×2 IMPLANT
DRAPE POUCH INSTRU U-SHP 10X18 (DRAPES) ×2 IMPLANT
DRAPE SURG 17X23 STRL (DRAPES) ×2 IMPLANT
DRAPE U-SHAPE 47X51 STRL (DRAPES) ×2 IMPLANT
DRSG OPSITE POSTOP 4X6 (GAUZE/BANDAGES/DRESSINGS) ×2 IMPLANT
DURAPREP 26ML APPLICATOR (WOUND CARE) ×2 IMPLANT
ELECT COATED BLADE 2.86 ST (ELECTRODE) ×2 IMPLANT
ELECT PENCIL ROCKER SW 15FT (MISCELLANEOUS) ×2 IMPLANT
ELECT REM PT RETURN 9FT ADLT (ELECTROSURGICAL) ×2
ELECTRODE REM PT RTRN 9FT ADLT (ELECTROSURGICAL) ×1 IMPLANT
FUSION TCS NANOLOCK 7MM 6DEG (Cage) ×4 IMPLANT
FUSION TCS NANOLOCK 8MM 6DEG (Cage) ×2 IMPLANT
GLOVE SURG ENC MOIS LTX SZ6.5 (GLOVE) ×2 IMPLANT
GLOVE SURG MICRO LTX SZ8.5 (GLOVE) ×2 IMPLANT
GLOVE SURG UNDER POLY LF SZ6.5 (GLOVE) ×2 IMPLANT
GLOVE SURG UNDER POLY LF SZ8.5 (GLOVE) ×2 IMPLANT
GOWN STRL REUS W/ TWL LRG LVL3 (GOWN DISPOSABLE) ×2 IMPLANT
GOWN STRL REUS W/TWL 2XL LVL3 (GOWN DISPOSABLE) ×4 IMPLANT
GOWN STRL REUS W/TWL LRG LVL3 (GOWN DISPOSABLE) ×4
KIT BASIN OR (CUSTOM PROCEDURE TRAY) ×2 IMPLANT
KIT TURNOVER KIT B (KITS) ×2 IMPLANT
MIX DBX 10CC 35% BONE (Bone Implant) ×2 IMPLANT
NEEDLE HYPO 22GX1.5 SAFETY (NEEDLE) ×2 IMPLANT
NEEDLE SPNL 18GX3.5 QUINCKE PK (NEEDLE) ×2 IMPLANT
NS IRRIG 1000ML POUR BTL (IV SOLUTION) ×2 IMPLANT
PACK ORTHO CERVICAL (CUSTOM PROCEDURE TRAY) ×2 IMPLANT
PACK UNIVERSAL I (CUSTOM PROCEDURE TRAY) ×2 IMPLANT
PAD ARMBOARD 7.5X6 YLW CONV (MISCELLANEOUS) ×6 IMPLANT
PATTIES SURGICAL .25X.25 (GAUZE/BANDAGES/DRESSINGS) ×2 IMPLANT
PATTIES SURGICAL .5 X.5 (GAUZE/BANDAGES/DRESSINGS) IMPLANT
PLATE LOCK ENDO TCS (Plate) ×3 IMPLANT
POSITIONER HEAD DONUT 9IN (MISCELLANEOUS) ×2 IMPLANT
RESTRAINT LIMB HOLDER UNIV (RESTRAINTS) ×2 IMPLANT
SCREW 3.8X16MM (Screw) ×2 IMPLANT
SCREW ENDO BONE 3.8X14MM (Screw) ×2 IMPLANT
SCREW LOCK 3.5X16 (Screw) ×2 IMPLANT
SCREW LOCK STD 16X3.5XSPNE CVD (Screw) ×1 IMPLANT
SCREW LOCKING 14MMX3.5MM (Screw) ×6 IMPLANT
SPONGE INTESTINAL PEANUT (DISPOSABLE) ×8 IMPLANT
SPONGE SURGIFOAM ABS GEL 100 (HEMOSTASIS) ×2 IMPLANT
SPONGE T-LAP 4X18 ~~LOC~~+RFID (SPONGE) ×4 IMPLANT
SURGIFLO W/THROMBIN 8M KIT (HEMOSTASIS) IMPLANT
SUT BONE WAX W31G (SUTURE) ×2 IMPLANT
SUT MNCRL AB 3-0 PS2 27 (SUTURE) ×2 IMPLANT
SUT SILK 2 0 (SUTURE) ×2
SUT SILK 2-0 18XBRD TIE 12 (SUTURE) ×1 IMPLANT
SUT VIC AB 2-0 CT1 18 (SUTURE) ×2 IMPLANT
SYR BULB IRRIG 60ML STRL (SYRINGE) ×2 IMPLANT
SYR CONTROL 10ML LL (SYRINGE) ×2 IMPLANT
TAPE CLOTH 4X10 WHT NS (GAUZE/BANDAGES/DRESSINGS) ×2 IMPLANT
TAPE UMBILICAL COTTON 1/8X30 (MISCELLANEOUS) ×2 IMPLANT
TOWEL GREEN STERILE (TOWEL DISPOSABLE) ×2 IMPLANT
TOWEL GREEN STERILE FF (TOWEL DISPOSABLE) ×2 IMPLANT
TRAY FOLEY MTR SLVR 16FR STAT (SET/KITS/TRAYS/PACK) ×2 IMPLANT
WATER STERILE IRR 1000ML POUR (IV SOLUTION) ×2 IMPLANT

## 2020-10-08 NOTE — Discharge Instructions (Signed)

## 2020-10-08 NOTE — Transfer of Care (Signed)
Immediate Anesthesia Transfer of Care Note  Patient: Elijah Thompson  Procedure(s) Performed: ANTERIOR CERVICAL DECOMPRESSION/DISCECTOMY FUSION 3 LEVELS  C3-6 (Spine Cervical)  Patient Location: PACU  Anesthesia Type:General  Level of Consciousness: awake, alert , drowsy and patient cooperative  Airway & Oxygen Therapy: Patient Spontanous Breathing and Patient connected to face mask oxygen  Post-op Assessment: Report given to RN, Post -op Vital signs reviewed and stable and Patient moving all extremities X 4  Post vital signs: Reviewed and stable  Last Vitals:  Vitals Value Taken Time  BP    Temp    Pulse 108 10/08/20 1216  Resp 20 10/08/20 1216  SpO2 95 % 10/08/20 1216  Vitals shown include unvalidated device data.  Last Pain:  Vitals:   10/08/20 0614  TempSrc:   PainSc: 1       Patients Stated Pain Goal: 2 (10/08/20 9935)  Complications: No notable events documented.

## 2020-10-08 NOTE — Op Note (Signed)
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  Individual locking plates at each level also applied.  Graft: DBX mix  Neuro monitoring: Improvement in SSEP/evoked motor potentials at the conclusion of the case.  No adverse activity throughout the case.  BRIEF HISTORY: Booker Bhatnagar is a 65 y.o. male who presented to my office with complaints of severe neck and right radicular leg pain.  Imaging studies demonstrated cord signal changes as well as significant foraminal stenosis.  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  As a result of the neurological deficits and imaging studies we elected to move forward with surgery.  All appropriate risks benefits and alternatives were gust with the patient and consent from the superior aspect of L3 to the inferior aspect of C6 bilaterally.  Once I had the longus coli muscles mobilized was obtained.  PROCEDURE DETAILS: Patient was brought into the operating room.  He was properly positioned on the operating room table, and teds and SCDs were applied.  Patient received general anesthesia and then was endotracheally intubated.  A timeout was taken to confirm all important data: Including patient, procedure, and the level.  Anterior cervical spine was then prepped and draped in a standard fashion.  Using fluoroscopy I marked out the C3 and C6 vertebral bodies and then drew out my left-sided longitudinal incision along the sternocleidomastoid.  I plan on performing a standard Smith-Robinson approach to the  cervical spine the longitudinal incision was infiltrated with quarter percent Marcaine with epinephrine and the incision was made.  Sharp dissection was carried out down to the platysma.  I dissected through the platysma and carried my dissection down to the deep cervical fascia.  I released the omohyoid from its length and I could now freely mobilize the trachea and esophagus over to the right and visualize the anterior cervical spine.  Leash of vessels were then identified and tied off with a silk tie.  This allowed complete visualization.  At this point I placed a needle into the C3-4 disc space and took an x-ray to confirm I was at the appropriate level.  Once this was done I mobilized the longus coli muscle using bipolar electrocautery.  Caspar retractor blades were placed underneath the longus coli muscle and the endotracheal cuff was deflated and I expanded the endotracheal cuff to the final width.  I then reinflated the endotracheal cuff.    An annulotomy was performed and I remove the bulk of the disc material.  Caspar pins were placed into the body of C3 and C4 and I distracted the intervertebral space and maintained this with the distraction pin set.  Using curettes and Kerrison rongeurs I removed all the remaining disc material.  Using a 1 mm Kerrison rongeur and then trim down the posterior osteophyte from the vertebral body of C3 and C4.  I then gently dissected using my nerve hook through the posterior annulus and posterior longitudinal ligament.  I then used a 1 mm Kerrison rongeur to resect the PLL.  I then was able to resect the uncovertebral joint osteophyte to further decompress the nerve  root.  At this point I was able to freely 1 my nerve hook underneath the body of C3 and C4 and I confirmed this with fluoroscopy.  I also confirmed that had adequate decompression under the uncovertebral joints.  With the decompression completed I then rasped the endplates still had bleeding subchondral bone.   I used the trial intervertebral spacers and elected to use a 7 mm medium lordotic spacer.  This was packed with the allograft and malleted to the appropriate depth.  2 locking screws were then placed through the cage and into the vertebral body.  Both screws had excellent purchase.  The locking plate was then placed over top of the device and secured in to the cage and then torqued off.  At this point with the C3-4 disc ectomy and fusion complete I repositioned my retractors to expose the C4-5 disc space.  Using the exact same technique I used at C3-4 I performed a complete discectomy at C4-5.  I again took down the posterior longitudinal ligament with my 1 mm Kerrison rongeur and undercut the uncovertebral joints.  There were large central osteophytes which I was able to resect.  After the decompression I again was able to confirm with fluoroscopy adequate discectomy and decompression I able to passing my nerve hook behind the febrile body of C4 and C5 and under the uncovertebral joints.  I again measured the intervertebral space and rasped the endplates until I had bleeding subchondral bone.  I then obtained the 7 mm medium lordotic Titan 0 profile cage packed with the allograft and malleted to the appropriate depth.  I then secured it with 2 locking screws.  Both screws again had excellent purchase.  The anterior plate was then affixed to the cage and torqued off according manufactures instructions.  At this point, I then repositioned my retractors to expose the C5-6 disc space.  Using the same exact technique I used at the other 2 levels I performed a complete discectomy at C5-6.  I again made sure to decompress posteriorly by removing the annulus and taking down the posterior osteophytes.  I also undercut the uncovertebral joints to further decompress the nerve root.  At this point with the discectomy complete I rasped the endplates and this time I was able to place an 8 mm medium lordotic spacer.  The cage  was packed with the DBX mix and malleted to the appropriate depth I again fixed it with 2 locking screws both of which obtained excellent purchase.  The anterior plate was then applied and secured into position according to manufacture standards.  All retractors were then removed and I copiously irrigated the wound with normal saline.  Using bipolar cautery and Floseal I made sure that hemostasis.  After final irrigation use the esophagus and trachea were returned back to midline and final x-rays were taken.  Satisfactory position of implants in both the AP and lateral planes.    The platysma was then reapproximated with interrupted 2-0 Vicryl suture and the skin was closed with a 3-0 Monocryl stitch.  Steri-Strips and a dry dressing were applied and the patient was ultimately extubated transfer the PACU without incident.  The end of the case all needle and sponge counts were correct.  There were no adverse intraoperative events.  Venita Lick, MD 10/08/2020 11:42 AM

## 2020-10-08 NOTE — Anesthesia Procedure Notes (Signed)
Procedure Name: Intubation Date/Time: 10/08/2020 7:41 AM Performed by: Shanon Payor, CRNA Pre-anesthesia Checklist: Patient identified, Emergency Drugs available, Suction available, Patient being monitored and Timeout performed Patient Re-evaluated:Patient Re-evaluated prior to induction Oxygen Delivery Method: Circle system utilized Preoxygenation: Pre-oxygenation with 100% oxygen Induction Type: IV induction Laryngoscope Size: Glidescope and 4 Grade View: Grade I Tube type: Oral Tube size: 7.5 mm Number of attempts: 1 Airway Equipment and Method: Stylet and Video-laryngoscopy Placement Confirmation: ETT inserted through vocal cords under direct vision, positive ETCO2 and breath sounds checked- equal and bilateral Secured at: 24 cm Tube secured with: Tape Dental Injury: Teeth and Oropharynx as per pre-operative assessment

## 2020-10-08 NOTE — H&P (Signed)
HPI Elijah Thompson is a very pleasant 65 year old gentleman who presents with complaints of significant neck pain and dysesthesias.  Imaging studies and clinical exam was consistent with cervical spondylitic myelopathy.  As a result of the clinical and radiographic findings we elected to move forward with surgical intervention.  Patient did have preoperative medical clearance by his primary care physician, and we reviewed the risks, benefits and alternatives to surgery.  Patient is expressed an understanding of these risks as well as a willingness to move forward with surgery.  Allergies NKDA  Medications Reviewed Medications Aspirin azithromycin 500 mg tablet codeine 10 mg-guaifenesin 100 mg/5 mL oral liquid metoprolol succinate ER 25 mg tablet,extended release 24 hr tamsulosin 0.4 mg capsule  Problems Hand joint pain - Onset: 06/03/2020, Bilateral Cervical myelopathy - Onset: 09/07/2020 Neck pain - Onset: 08/06/2020 Cervical disc disorder with radiculopathy - Onset: 08/06/2020 Carpal tunnel syndrome of right wrist - Onset: 06/22/2020 Carpal tunnel syndrome of left wrist - Onset: 06/22/2020 Pain of left shoulder joint - Onset: 08/04/2020  Past Medical History Heart Problems:  Joint Pain  Physical Exam  Clinical exam: Elijah Thompson is a pleasant individual, who appears younger than their stated age. He is alert and orientated 3. No shortness of breath, chest pain.  Abdomen is soft and non-tender, negative loss of bowel and bladder control, no rebound tenderness. BSx4  Heart: Regular rate and rhythm, no rubs, murmurs, or gallops  Lungs: Clear auscultation bilaterally  Negative: skin lesions abrasions contusions Peripheral pulses: 2+ radial artery pulses bilaterally. Compartment soft and nontender. Gait pattern: Negative Romberg's test. Patient does describe subjective balance control issues. He is able to heel to ambulate. Assistive devices: Negative Neuro: 5/5 motor strength in the upper  extremity bilaterally. Positive Babinski test, positive right radicular arm pain. Negative Hoffman test, no clonus, negative inverted brachioradialis reflex. Positive dysesthesias in the upper and lower extremity. Musculoskeletal: Moderate to severe neck pain with palpation and range of motion.   Cervical x-rays taken today in the office (AP/lateral) were reviewed: Demonstrate multilevel degenerative disease C3-T1. Positive loss of normal lordosis. No fracture is seen.  Cervical MRI: completed on 09/01/20 was reviewed with the patient. It was completed at [facility]; I have independently reviewed the images as well as the radiology report. Significant degenerative cervical spondylitic disease C3-6 with cord signal changes at C4-5. Severe spinal stenosis and foraminal stenosis C4-5. Severe bilateral neuroforaminal narrowing at C3-4 and C5-6. Severe left moderate to severe right C6-7 foraminal narrowing with some impingement on the left C7 nerve root. Mild neural foraminal narrowing at C7-T1.  Assessment / Plan Diagnosis: Elijah Thompson returns today for follow-up with his wife to discuss surgical intervention for cervical spondylitic myelopathy. I have gone through his imaging studies with the patient and his wife and his clinical exam. There is been no change since his last office visit of 09/07/20. He continues to have functional deficits consistent with cervical spondylitic myelopathy. He has multilevel degenerative disease but most prominent issues C3-6. At this point time the goal of surgery is to prevent worsening of his myelopathy. All of his and his wife's questions were addressed. We have gone over the risks, benefits, alternatives to surgery. Risks and benefits of surgery were discussed with the patient. These include: Infection, bleeding, death, stroke, paralysis, ongoing or worse pain, need for additional surgery, nonunion, leak of spinal fluid, adjacent segment degeneration requiring additional fusion  surgery. Pseudoarthrosis (nonunion)requiring supplemental posterior fixation. Throat pain, swallowing difficulties, hoarseness or change in voice.  With obtain preoperative medical  clearance from the patient's primary care provider as well as his cardiologist.  I did review the patient's medication list. He would hold his aspirin 81 mg 1 week prior to surgery we will plan to restart this 48 hours after surgery  We have also discussed the post-operative recovery period to include: bathing/showering restrictions, wound healing, activity (and driving) restrictions, medications/pain mangement.  We have also discussed post-operative redflags to include: signs and symptoms of postoperative infection, DVT/PE.

## 2020-10-08 NOTE — Anesthesia Postprocedure Evaluation (Signed)
Anesthesia Post Note  Patient: Elijah Thompson  Procedure(s) Performed: ANTERIOR CERVICAL DECOMPRESSION/DISCECTOMY FUSION 3 LEVELS  C3-6 (Spine Cervical)     Patient location during evaluation: PACU Anesthesia Type: General Level of consciousness: awake and alert Pain management: pain level controlled Vital Signs Assessment: post-procedure vital signs reviewed and stable Respiratory status: spontaneous breathing, nonlabored ventilation and respiratory function stable Cardiovascular status: stable and blood pressure returned to baseline Anesthetic complications: no   No notable events documented.  Last Vitals:  Vitals:   10/08/20 1258 10/08/20 1317  BP: (!) 148/74 (!) 148/74  Pulse: 85 86  Resp: 16   Temp:    SpO2: 97% 97%    Last Pain:  Vitals:   10/08/20 1243  TempSrc:   PainSc: 3                  Beryle Lathe

## 2020-10-08 NOTE — Brief Op Note (Signed)
10/08/2020  11:54 AM  PATIENT:  Thedora Hinders  65 y.o. male  PRE-OPERATIVE DIAGNOSIS:  Cervical myelopathy with degenerative disc disease  POST-OPERATIVE DIAGNOSIS:  Cervical myelopathy with degenerative disc disease  PROCEDURE:  Procedure(s) with comments: ANTERIOR CERVICAL DECOMPRESSION/DISCECTOMY FUSION 3 LEVELS  C3-6 (N/A) - 4.5 hrs  SURGEON:  Surgeon(s) and Role:    Venita Lick, MD - Primary  PHYSICIAN ASSISTANT:   ASSISTANTS: Voncille Lo, PA  ANESTHESIA:   general  EBL:  50 mL   BLOOD ADMINISTERED:none  DRAINS: none   LOCAL MEDICATIONS USED:  MARCAINE     SPECIMEN:  No Specimen  DISPOSITION OF SPECIMEN:  N/A  COUNTS:  YES  TOURNIQUET:  * No tourniquets in log *  DICTATION: .Dragon Dictation  PLAN OF CARE: Admit for overnight observation  PATIENT DISPOSITION:  PACU - hemodynamically stable.

## 2020-10-09 ENCOUNTER — Encounter (HOSPITAL_COMMUNITY): Payer: Self-pay | Admitting: Orthopedic Surgery

## 2020-10-09 DIAGNOSIS — M50022 Cervical disc disorder at C5-C6 level with myelopathy: Secondary | ICD-10-CM | POA: Diagnosis not present

## 2020-10-09 MED FILL — Thrombin (Recombinant) For Soln 20000 Unit: CUTANEOUS | Qty: 1 | Status: AC

## 2020-10-09 NOTE — Evaluation (Signed)
Physical Therapy Evaluation Patient Details Name: Elijah Thompson MRN: [id number] DOB: 1955/11/23 Today's Date: 10/09/2020   History of Present Illness  Elijah Thompson is a 65 y.o. male who presents with complaints of significant neck pain and dysesthesias. Imaging showed cervical spondylitic myelopathy. Pt elected to undergo ACDF C3-6 region. PMH: joint pain, carpal tunnel syndrome B UE   Clinical Impression  Pt in bed upon arrival of PT, agreeable to evaluation at this time. Prior to admission the pt was completely independent without need for assistance or AD. The pt now presents with minor limitations in functional mobility, activity tolerance, and dynamic stability due to above dx, but is safe to return home with intermittent assist from family. The pt was educated in cervical precautions, brace use, and progressive return to activity. The pt completed hallway ambulation and stair navigation without physical assist or LOB. No further acute PT needs. Thank you for the consult.      Follow Up Recommendations No PT follow up;Supervision - Intermittent    Equipment Recommendations  None recommended by PT    Recommendations for Other Services       Precautions / Restrictions Precautions Precautions: Fall;Cervical Precaution Booklet Issued: Yes (comment) Precaution Comments: discussed verbally, handout given Required Braces or Orthoses: Cervical Brace Cervical Brace: Hard collar (off for dressing/bathing, can remove in bed) Restrictions Weight Bearing Restrictions: No      Mobility  Bed Mobility Overal bed mobility: Independent             General bed mobility comments: able to complete without issue or breaking cervical precautions    Transfers Overall transfer level: Independent Equipment used: None             General transfer comment: no evidence of instability  Ambulation/Gait Ambulation/Gait assistance: Independent Gait Distance (Feet): 150 Feet Assistive  device: None Gait Pattern/deviations: WFL(Within Functional Limits) Gait velocity: WFL Gait velocity interpretation: 1.31 - 2.62 ft/sec, indicative of limited community ambulator General Gait Details: discussed fall risk reduction, potential use of SPC for longer distances or on uneven surfaces.  Stairs Stairs: Yes Stairs assistance: Supervision Stair Management: One rail Left;Alternating pattern;Forwards Number of Stairs: 10    Wheelchair Mobility    Modified Rankin (Stroke Patients Only)       Balance Overall balance assessment: Independent                                           Pertinent Vitals/Pain Pain Assessment: Faces Faces Pain Scale: Hurts a little bit Pain Location: generalized at neck Pain Descriptors / Indicators: Sore;Operative site guarding Pain Intervention(s): Limited activity within patient's tolerance    Home Living Family/patient expects to be discharged to:: Private residence Living Arrangements: Spouse/significant other Available Help at Discharge: Family Type of Home: House Home Access: Stairs to enter Entrance Stairs-Rails: Right Entrance Stairs-Number of Steps: 5 through garage, level entry through side door Home Layout: One level Home Equipment: Hand held shower head Additional Comments: pt reports plan to install grab bar at toilet, reports MD told him to use cooler as shower chair    Prior Function Level of Independence: Independent         Comments: works at [employer], enjoys walking at [employer]   Dominant Hand: Right    Extremity/Trunk Assessment   Upper Extremity Assessment Upper Extremity Assessment: Defer to OT evaluation (  pt endorses tingling of UE) RUE Deficits / Details: endorses continued tingling of first 3 digits. reports pain in shoulder region as resolved LUE Deficits / Details: endorses continued tingling of first 3 digits. reports pain in shoulder region as resolved     Lower Extremity Assessment Lower Extremity Assessment: Overall WFL for tasks assessed    Cervical / Trunk Assessment Cervical / Trunk Assessment: Other exceptions Cervical / Trunk Exceptions: s/p cervical surgery  Communication   Communication: No difficulties  Cognition Arousal/Alertness: Awake/alert Behavior During Therapy: WFL for tasks assessed/performed Overall Cognitive Status: Within Functional Limits for tasks assessed                                 General Comments: pt appears to be at baseline      General Comments General comments (skin integrity, edema, etc.): Wife present during session, endorses being able to complete iADLs as needed at home    Exercises     Assessment/Plan    PT Assessment Patent does not need any further PT services  PT Problem List         PT Treatment Interventions      PT Goals (Current goals can be found in the Care Plan section)  Acute Rehab PT Goals Patient Stated Goal: get back to driving, normal activities soon PT Goal Formulation: With patient Time For Goal Achievement: 10/23/20 Potential to Achieve Goals: Good    Frequency     Barriers to discharge        Co-evaluation               AM-PAC PT "6 Clicks" Mobility  Outcome Measure Help needed turning from your back to your side while in a flat bed without using bedrails?: None Help needed moving from lying on your back to sitting on the side of a flat bed without using bedrails?: None Help needed moving to and from a bed to a chair (including a wheelchair)?: None Help needed standing up from a chair using your arms (e.g., wheelchair or bedside chair)?: None Help needed to walk in hospital room?: None Help needed climbing 3-5 steps with a railing? : None 6 Click Score: 24    End of Session Equipment Utilized During Treatment: Gait belt;Cervical collar Activity Tolerance: Patient tolerated treatment well Patient left: in bed;with call  bell/phone within reach;with family/visitor present Nurse Communication: Mobility status PT Visit Diagnosis: Other abnormalities of gait and mobility (R26.89);Pain    Time: 0800-0819 PT Time Calculation (min) (ACUTE ONLY): 19 min   Charges:   PT Evaluation $PT Eval Low Complexity: 1 Low          Janyce Ellinger [place] Hun, PT, DPT   Acute Rehabilitation Department Pager #: [phone]  Gaetana Michaelis 10/09/2020, 8:27 AM

## 2020-10-09 NOTE — Evaluation (Signed)
Occupational Therapy Evaluation/Discharge Patient Details Name: Elijah Thompson MRN: [id number] DOB: [birth year] Today's Date: 10/09/2020    History of Present Illness Kanton Kamel is a 65 y.o. male who presents with complaints of significant neck pain and dysesthesias. Imaging showed cervical spondylitic myelopathy. Pt elected to undergo ACDF C3-6 region. PMH: joint pain, carpal tunnel syndrome B UE   Clinical Impression   PTA, pt lives with spouse and reports Independence in all daily tasks without AD. Pt presents now with improved pain levels and continued tingling sensation in B 1st-3rd digits. Educated pt on cervical precautions with ADLs/IADLs, brace mgmt and wearing schedule and optimal body mechanics. Pt able to demo ADLs and mobility in room Independently without safety concerns. Wife present and confirms that she can assist with IADLs as needed at home. Pt denies any DME needs. No further OT services needed at acute level or on DC.     Follow Up Recommendations  No OT follow up    Equipment Recommendations  None recommended by OT    Recommendations for Other Services       Precautions / Restrictions Precautions Precautions: Fall;Cervical Precaution Booklet Issued: Yes (comment) Required Braces or Orthoses: Cervical Brace Cervical Brace: Hard collar (off for dressing/bathing, can remove in bed) Restrictions Weight Bearing Restrictions: No      Mobility Bed Mobility               General bed mobility comments: received in bathroom    Transfers Overall transfer level: Independent Equipment used: None                  Balance Overall balance assessment: Independent                                         ADL either performed or assessed with clinical judgement   ADL Overall ADL's : Independent                                       General ADL Comments: Educated on cervical precautions for ADLs/IADLs with handout  provided. Pt in bathroom on entry, able to complete remainder of task without issue. Pt Independent with dressing tasks, able to verbalize how to manage hard collar well.     Vision Patient Visual Report: No change from baseline Vision Assessment?: No apparent visual deficits     Perception     Praxis      Pertinent Vitals/Pain Pain Assessment: Faces Faces Pain Scale: Hurts a little bit Pain Location: generalized at neck Pain Descriptors / Indicators: Sore;Operative site guarding Pain Intervention(s): Monitored during session     Hand Dominance Right   Extremity/Trunk Assessment Upper Extremity Assessment Upper Extremity Assessment: RUE deficits/detail;LUE deficits/detail RUE Deficits / Details: endorses continued tingling of first 3 digits. reports pain in shoulder region as resolved LUE Deficits / Details: endorses continued tingling of first 3 digits. reports pain in shoulder region as resolved   Lower Extremity Assessment Lower Extremity Assessment: Defer to PT evaluation   Cervical / Trunk Assessment Cervical / Trunk Assessment: Normal   Communication Communication Communication: No difficulties   Cognition Arousal/Alertness: Awake/alert Behavior During Therapy: WFL for tasks assessed/performed Overall Cognitive Status: Within Functional Limits for tasks assessed  General Comments  Wife present during session, endorses being able to complete iADLs as needed at home    Exercises     Shoulder Instructions      Home Living Family/patient expects to be discharged to:: Private residence Living Arrangements: Spouse/significant other Available Help at Discharge: Family Type of Home: House Home Access: Stairs to enter [employer] of Steps: 5 through garage, level entry through side door Entrance Stairs-Rails: Right Home Layout: One level     Bathroom Shower/Tub: Chief Strategy Officer:  Standard     Home Equipment: Hand held shower head   Additional Comments: pt reports plan to install grab bar at toilet, reports MD told him to use cooler as shower chair      Prior Functioning/Environment Level of Independence: Independent                 OT Problem List: Impaired sensation      OT Treatment/Interventions:      OT Goals(Current goals can be found in the care plan section) Acute Rehab OT Goals Patient Stated Goal: get back to driving, normal activities soon OT Goal Formulation: All assessment and education complete, DC therapy  OT Frequency:     Barriers to D/C:            Co-evaluation              AM-PAC OT "6 Clicks" Daily Activity     Outcome Measure Help from another person eating meals?: None Help from another person taking care of personal grooming?: None Help from another person toileting, which includes using toliet, bedpan, or urinal?: None Help from another person bathing (including washing, rinsing, drying)?: None Help from another person to put on and taking off regular upper body clothing?: None Help from another person to put on and taking off regular lower body clothing?: None 6 Click Score: 24   End of Session Equipment Utilized During Treatment: Cervical collar Nurse Communication: Mobility status  Activity Tolerance: Patient tolerated treatment well Patient left: in bed;with call bell/phone within reach;with family/visitor present  OT Visit Diagnosis: Pain Pain - part of body:  (neck)                Time: 1610-9604 OT Time Calculation (min): 18 min Charges:  OT General Charges $OT Visit: 1 Visit OT Evaluation $OT Eval Low Complexity: 1 Low  Bradd Canary, OTR/L Acute Rehab Services Office: [phone]   Lorre Munroe 10/09/2020, 7:24 AM

## 2020-10-09 NOTE — Progress Notes (Signed)
    Subjective: Procedure(s) (LRB): ANTERIOR CERVICAL DECOMPRESSION/DISCECTOMY FUSION 3 LEVELS  C3-6 (N/A) 1 Day Post-Op  Patient reports pain as 0 on 0-10 scale.  Reports decreased arm pain denies incisional neck pain   Positive void Negative bowel movement Positive flatus Negative chest pain or shortness of breath  Objective: Vital signs in last 24 hours: Temp:  [97.8 F (36.6 C)-98.6 F (37 C)] 97.8 F (36.6 C) (07/15 0734) Pulse Rate:  [71-106] 71 (07/15 0734) Resp:  [12-20] 19 (07/15 0734) BP: (130-159)/(74-93) 139/83 (07/15 0734) SpO2:  [96 %-98 %] 98 % (07/15 0734)  Intake/Output from previous day: 07/14 0701 - 07/15 0700 In: 1050 [I.V.:1000; IV Piggyback:50] Out: 450 [Urine:400; Blood:50]  Labs: No results for input(s): WBC, RBC, HCT, PLT in the last 72 hours. No results for input(s): NA, K, CL, CO2, BUN, CREATININE, GLUCOSE, CALCIUM in the last 72 hours. No results for input(s): LABPT, INR in the last 72 hours.  Physical Exam: Neurologically intact Neurovascular intact Sensation intact distally Intact pulses distally Incision: dressing C/D/I and no drainage Compartment soft Body mass index is 27.68 kg/m.  Assessment/Plan: Patient stable  xrays n/a Mobilization with physical therapy Encourage incentive spirometry Continue care  Advance diet Up with therapy Exceptionally well.  No significant swelling or tenderness at the incision site.  Plan on discharge to home.  Appropriate instructions and follow-up were provided.  Venita Lick, MD [facility] [phone]'

## 2020-10-09 NOTE — Progress Notes (Signed)
Patient alert and oriented, voiding adequately, MAE well with no difficulty. Incision area cdi with no s/s of infection. Patient discharged home per order. Patient and wife stated understanding of discharge instructions given. Patient has an appointment with Dr. Shon Baton

## 2020-10-12 ENCOUNTER — Other Ambulatory Visit: Payer: Self-pay

## 2020-10-12 ENCOUNTER — Encounter (HOSPITAL_BASED_OUTPATIENT_CLINIC_OR_DEPARTMENT_OTHER): Payer: Self-pay | Admitting: *Deleted

## 2020-10-12 ENCOUNTER — Emergency Department (HOSPITAL_BASED_OUTPATIENT_CLINIC_OR_DEPARTMENT_OTHER)
Admission: EM | Admit: 2020-10-12 | Discharge: 2020-10-12 | Disposition: A | Discharge status: Home / Self Care | Payer: 59 | Attending: Emergency Medicine | Admitting: Emergency Medicine

## 2020-10-12 DIAGNOSIS — J45909 Unspecified asthma, uncomplicated: Secondary | ICD-10-CM | POA: Diagnosis not present

## 2020-10-12 DIAGNOSIS — Z79899 Other long term (current) drug therapy: Secondary | ICD-10-CM | POA: Diagnosis not present

## 2020-10-12 DIAGNOSIS — R55 Syncope and collapse: Secondary | ICD-10-CM | POA: Diagnosis present

## 2020-10-12 DIAGNOSIS — Z8616 Personal history of COVID-19: Secondary | ICD-10-CM | POA: Diagnosis not present

## 2020-10-12 DIAGNOSIS — Z87891 Personal history of nicotine dependence: Secondary | ICD-10-CM | POA: Insufficient documentation

## 2020-10-12 LAB — URINALYSIS, ROUTINE W REFLEX MICROSCOPIC
Bilirubin Urine: NEGATIVE
Glucose, UA: NEGATIVE mg/dL
Ketones, ur: 15 mg/dL — AB
Leukocytes,Ua: NEGATIVE
Nitrite: NEGATIVE
Protein, ur: 30 mg/dL — AB
Specific Gravity, Urine: 1.023 (ref 1.005–1.030)
pH: 5.5 (ref 5.0–8.0)

## 2020-10-12 LAB — COMPREHENSIVE METABOLIC PANEL
ALT: 31 U/L (ref 0–44)
AST: 24 U/L (ref 15–41)
Albumin: 4.5 g/dL (ref 3.5–5.0)
Alkaline Phosphatase: 73 U/L (ref 38–126)
Anion gap: 10 (ref 5–15)
BUN: 15 mg/dL (ref 8–23)
CO2: 31 mmol/L (ref 22–32)
Calcium: 9.9 mg/dL (ref 8.9–10.3)
Chloride: 95 mmol/L — ABNORMAL LOW (ref 98–111)
Creatinine, Ser: 0.88 mg/dL (ref 0.61–1.24)
GFR, Estimated: >60 mL/min (ref >60)
Glucose, Bld: 106 mg/dL — ABNORMAL HIGH (ref 70–99)
Potassium: 4 mmol/L (ref 3.5–5.1)
Sodium: 136 mmol/L (ref 135–145)
Total Bilirubin: 0.9 mg/dL (ref 0.3–1.2)
Total Protein: 7.8 g/dL (ref 6.5–8.1)

## 2020-10-12 LAB — CBC WITH DIFFERENTIAL/PLATELET
Abs Immature Granulocytes: 0.07 10*3/uL (ref 0.00–0.07)
Basophils Absolute: 0 10*3/uL (ref 0.0–0.1)
Basophils Relative: 0 %
Eosinophils Absolute: 0.1 10*3/uL (ref 0.0–0.5)
Eosinophils Relative: 1 %
HCT: 48.9 % (ref 39.0–52.0)
Hemoglobin: 17.1 g/dL — ABNORMAL HIGH (ref 13.0–17.0)
Immature Granulocytes: 1 %
Lymphocytes Relative: 8 %
Lymphs Abs: 0.9 10*3/uL (ref 0.7–4.0)
MCH: 31.8 pg (ref 26.0–34.0)
MCHC: 35 g/dL (ref 30.0–36.0)
MCV: 91.1 fL (ref 80.0–100.0)
Monocytes Absolute: 0.7 10*3/uL (ref 0.1–1.0)
Monocytes Relative: 6 %
Neutro Abs: 9.9 10*3/uL — ABNORMAL HIGH (ref 1.7–7.7)
Neutrophils Relative %: 84 %
Platelets: 258 10*3/uL (ref 150–400)
RBC: 5.37 MIL/uL (ref 4.22–5.81)
RDW: 13.2 % (ref 11.5–15.5)
WBC: 11.7 10*3/uL — ABNORMAL HIGH (ref 4.0–10.5)
nRBC: 0 % (ref 0.0–0.2)

## 2020-10-12 MED ORDER — SODIUM CHLORIDE 0.9 % IV BOLUS
1000.0000 mL | Freq: Once | INTRAVENOUS | Status: AC
  Administered 2020-10-12: 1000 mL via INTRAVENOUS

## 2020-10-12 MED ORDER — MORPHINE SULFATE (PF) 4 MG/ML IV SOLN
4.0000 mg | Freq: Once | INTRAVENOUS | Status: DC
  Filled 2020-10-12: qty 1

## 2020-10-12 MED ORDER — ONDANSETRON HCL 4 MG/2ML IJ SOLN
4.0000 mg | Freq: Once | INTRAMUSCULAR | Status: DC
  Filled 2020-10-12: qty 2

## 2020-10-12 MED ORDER — SODIUM CHLORIDE 0.9 % IV SOLN: INTRAVENOUS | Status: DC

## 2020-10-12 NOTE — ED Notes (Signed)
Dr Particia Nearing gave consent for pt to drink fluids.

## 2020-10-12 NOTE — ED Notes (Signed)
Patient aware that urine sample needed 

## 2020-10-12 NOTE — ED Provider Notes (Signed)
[facility] EMERGENCY DEPT Provider Note   CSN: [id number] Arrival date & time: 10/12/20  1100     History Chief Complaint  Patient presents with   Near Syncope    Elijah Thompson is a 65 y.o. male.  Pt presents to the ED today with near syncope.  He had a anterior cervical fusion on 7/14.  He was d/c on 7/15.  He has not been drinking or eating much because his throat has been sore.  He started having nausea and his wife got him on the toilet.  She said his eyes rolled back and he passed out.  He denies any injury or fall when he passed out.  No f/c.      Past Medical History:  Diagnosis Date   Arthritis    Asthma in adult, mild intermittent, uncomplicated 10/04/2016   CAD (coronary artery disease), native coronary artery 10/04/2016   Without angina pectoris; s/p circumflex stent 2014   Chronic left shoulder pain 10/04/2016   Has had injections in the past.   COVID-19 07/23/2020   + test [facility] 07/23/20   Dyslipidemia 10/04/2016   Refused statin therapy in the past per PCP records   Myocardial infarction [facility])    Skin cyst 10/04/2016   Multiple at various times. Has had some removed in the past.    Patient Active Problem List   Diagnosis Date Noted   Fusion of spine, cervical region 10/08/2020   Asthma in adult, mild intermittent, uncomplicated 10/04/2016   Dyslipidemia 10/04/2016   CAD (coronary artery disease), native coronary artery 10/04/2016   Skin cyst 10/04/2016   Chronic left shoulder pain 10/04/2016    Past Surgical History:  Procedure Laterality Date   ANTERIOR CERVICAL DECOMP/DISCECTOMY FUSION N/A 10/08/2020   Procedure: ANTERIOR CERVICAL DECOMPRESSION/DISCECTOMY FUSION 3 LEVELS  C3-6;  Surgeon: Venita Lick, MD;  Location: [facility] OR;  Service: Orthopedics;  Laterality: N/A;  4.5 hrs   KNEE ARTHROSCOPY Left    MOUTH SURGERY     Wisdom tooth removal   SKIN LESION EXCISION     Local excision   Vascular stent left circumflex  Left 2014       Family History  Problem Relation Age of Onset   Hypercholesterolemia Father    Heart disease Father    Lung cancer Paternal Grandfather     Social History   Tobacco Use   Smoking status: Former    Types: Cigarettes    Quit date: 2014    Years since quitting: 8.5   Smokeless tobacco: Never   Tobacco comments:    Uses nicorette.  Vaping Use   Vaping Use: Never used  Substance Use Topics   Alcohol use: Yes    Alcohol/week: 14.0 standard drinks    Types: 14 Cans of beer per week   Drug use: Never    Home Medications Prior to Admission medications   Medication Sig Start Date End Date Taking? Authorizing Provider  albuterol (VENTOLIN HFA) 108 (90 Base) MCG/ACT inhaler Inhale 1-2 puffs into the lungs every 6 (six) hours as needed for wheezing or shortness of breath. 05/21/20   [provider]  Ascorbic Acid (VITAMIN C) 1000 MG tablet Take 3,000 mg by mouth in the morning and at bedtime.    [provider]  Carboxymeth-Glyc-Polysorb PF (REFRESH OPTIVE MEGA-3) 0.5-1-0.5 % SOLN Place 1 drop into both eyes daily.    [provider]  methocarbamol (ROBAXIN) 500 MG tablet Take 1 tablet (500 mg total) by  mouth every 8 (eight) hours as needed for up to 5 days for muscle spasms. 10/08/20 10/13/20  Venita Lick, MD  metoprolol succinate (TOPROL-XL) 25 MG 24 hr tablet Take 12.5 mg by mouth daily with breakfast. 09/01/20   [provider]  ondansetron (ZOFRAN) 4 MG tablet Take 1 tablet (4 mg total) by mouth every 8 (eight) hours as needed for nausea or vomiting. 10/08/20   Venita Lick, MD  oxyCODONE-acetaminophen (PERCOCET) 10-325 MG tablet Take 1 tablet by mouth every 6 (six) hours as needed for up to 5 days for pain. 10/08/20 10/13/20  Venita Lick, MD  tamsulosin (FLOMAX) 0.4 MG CAPS capsule Take 0.4 mg by mouth at bedtime. 09/03/20   [provider]    Allergies    Patient has no known allergies.  Review of Systems   Review of  Systems  Neurological:  Positive for syncope.  All other systems reviewed and are negative.  Physical Exam Updated Vital Signs BP 140/89   Pulse 75   Temp 97.6 F (36.4 C) (Oral)   Resp 20   Ht 5\' 10"  (1.778 m)   Wt 87.5 kg   SpO2 99%   BMI 27.68 kg/m   Physical Exam Vitals and nursing note reviewed.  Constitutional:      Appearance: Normal appearance.  HENT:     Head: Normocephalic and atraumatic.     Right Ear: External ear normal.     Left Ear: External ear normal.     Nose: Nose normal.     Mouth/Throat:     Mouth: Mucous membranes are moist.     Pharynx: Oropharynx is clear.  Eyes:     Extraocular Movements: Extraocular movements intact.     Conjunctiva/sclera: Conjunctivae normal.     Pupils: Pupils are equal, round, and reactive to light.  Neck:     Comments: In c-collar.  Mild swelling around surgical site.  No redness.  Bandage not removed as it's a special dressing that we don't have to replace it. Cardiovascular:     Rate and Rhythm: Normal rate and regular rhythm.     Pulses: Normal pulses.     Heart sounds: Normal heart sounds.  Pulmonary:     Effort: Pulmonary effort is normal.     Breath sounds: Normal breath sounds.  Abdominal:     General: Abdomen is flat. Bowel sounds are normal.     Palpations: Abdomen is soft.  Musculoskeletal:        General: Normal range of motion.  Skin:    General: Skin is warm.     Capillary Refill: Capillary refill takes less than 2 seconds.  Neurological:     General: No focal deficit present.     Mental Status: He is alert and oriented to person, place, and time.  Psychiatric:        Mood and Affect: Mood normal.        Behavior: Behavior normal.        Thought Content: Thought content normal.        Judgment: Judgment normal.    ED Results / Procedures / Treatments   Labs (all labs ordered are listed, but only abnormal results are displayed) Labs Reviewed  CBC WITH DIFFERENTIAL/PLATELET - Abnormal; Notable  for the following components:      Result Value   WBC 11.7 (*)    Hemoglobin 17.1 (*)    Neutro Abs 9.9 (*)    All other components within normal limits  COMPREHENSIVE METABOLIC  PANEL - Abnormal; Notable for the following components:   Chloride 95 (*)    Glucose, Bld 106 (*)    All other components within normal limits  URINALYSIS, ROUTINE W REFLEX MICROSCOPIC - Abnormal; Notable for the following components:   Hgb urine dipstick TRACE (*)    Ketones, ur 15 (*)    Protein, ur 30 (*)    All other components within normal limits  CBG MONITORING, ED    EKG EKG Interpretation  Date/Time:  Monday October 12 2020 11:19:35 EDT Ventricular Rate:  77 PR Interval:  167 QRS Duration: 98 QT Interval:  415 QTC Calculation: 470 R Axis:   -13 Text Interpretation: Sinus rhythm Minimal ST depression, inferior leads No significant change since last tracing Confirmed by Jacalyn Lefevre [phone]) on 10/12/2020 11:43:41 AM  Radiology No results found.  Procedures Procedures   Medications Ordered in ED Medications  sodium chloride 0.9 % bolus 1,000 mL (1,000 mLs Intravenous New Bag/Given 10/12/20 1122)    And  0.9 %  sodium chloride infusion (has no administration in time range)  morphine 4 MG/ML injection 4 mg (4 mg Intravenous Patient Refused/Not Given 10/12/20 1144)  ondansetron (ZOFRAN) injection 4 mg (4 mg Intravenous Patient Refused/Not Given 10/12/20 1145)    ED Course  I have reviewed the triage vital signs and the nursing notes.  Pertinent labs & imaging results that were available during my care of the patient were reviewed by me and considered in my medical decision making (see chart for details).    MDM Rules/Calculators/A&P                          Syncope is likely due to volume depletion.  He has ketones in his urine which was given after fluids.  Pt's initial orthostatics are +, but he is now able to get up and move around.  He is feeling much better and wants to go home.  He  does not want to get another bag of fluids.  He is to return if worse.  F/u with pcp/ortho. Final Clinical Impression(s) / ED Diagnoses Final diagnoses:  Syncope, unspecified syncope type    Rx / DC Orders ED Discharge Orders     None        Jacalyn Lefevre, MD 10/12/20 1233

## 2020-10-12 NOTE — Discharge Summary (Signed)
Patient ID: Elijah Thompson MRN: [id number] DOB/AGE: 1955/10/24 65 y.o.  Admit date: 10/08/2020 Discharge date: 10/09/2020  Admission Diagnoses:  Active Problems:   Fusion of spine, cervical region   Discharge Diagnoses:  Active Problems:   Fusion of spine, cervical region  status post Procedure(s): ANTERIOR CERVICAL DECOMPRESSION/DISCECTOMY FUSION 3 LEVELS  C3-6  Past Medical History:  Diagnosis Date   Arthritis    Asthma in adult, mild intermittent, uncomplicated 10/04/2016   CAD (coronary artery disease), native coronary artery 10/04/2016   Without angina pectoris; s/p circumflex stent 2014   Chronic left shoulder pain 10/04/2016   Has had injections in the past.   COVID-19 07/23/2020   + test [facility] 07/23/20   Dyslipidemia 10/04/2016   Refused statin therapy in the past per PCP records   Myocardial infarction [facility])    Skin cyst 10/04/2016   Multiple at various times. Has had some removed in the past.    Surgeries: Procedure(s):None ANTERIOR CERVICAL DECOMPRESSION/DISCECTOMY FUSION 3 LEVELS  C3-6 on 10/08/2020   Consultants:   Discharged Condition: Improved  Hospital Course: Elijah Thompson is an 65 y.o. male who was admitted 10/08/2020 for operative treatment of Cervical myelopathy with degenerative disc disease. Patient failed conservative treatments (please see the history and physical for the specifics) and had severe unremitting pain that affects sleep, daily activities and work/hobbies. After pre-op clearance, the patient was taken to the operating room on 10/08/2020 and underwent  Procedure(s): ANTERIOR CERVICAL DECOMPRESSION/DISCECTOMY FUSION 3 LEVELS  C3-6.    Patient was given perioperative antibiotics:  Anti-infectives (From admission, onward)    Start     Dose/Rate Route Frequency Ordered Stop   10/08/20 1500  ceFAZolin (ANCEF) IVPB 1 g/50 mL premix        1 g 100 mL/hr over 30 Minutes Intravenous Every 8 hours 10/08/20 1410 10/09/20 0000    10/08/20 0559  ceFAZolin (ANCEF) 2-4 GM/100ML-% IVPB       Note to Pharmacy: Wilburn Mylar   : cabinet override      10/08/20 0559 10/08/20 1814        Patient was given sequential compression devices and early ambulation to prevent DVT.   Patient benefited maximally from hospital stay and there were no complications. At the time of discharge, the patient was urinating/moving their bowels without difficulty, tolerating a regular diet, pain is controlled with oral pain medications and they have been cleared by PT/OT.   Recent vital signs: No data found.   Recent laboratory studies: No results for input(s): WBC, HGB, HCT, PLT, NA, K, CL, CO2, BUN, CREATININE, GLUCOSE, INR, CALCIUM in the last 72 hours.  Invalid input(s): PT, 2   Discharge Medications:   Allergies as of 10/09/2020   No Known Allergies      Medication List     STOP taking these medications    aspirin EC 81 MG tablet   multivitamin with minerals Tabs tablet       TAKE these medications    albuterol 108 (90 Base) MCG/ACT inhaler Commonly known as: VENTOLIN HFA Inhale 1-2 puffs into the lungs every 6 (six) hours as needed for wheezing or shortness of breath.   methocarbamol 500 MG tablet Commonly known as: Robaxin Take 1 tablet (500 mg total) by mouth every 8 (eight) hours as needed for up to 5 days for muscle spasms.   metoprolol succinate 25 MG 24 hr tablet Commonly known as: TOPROL-XL Take 12.5 mg by mouth daily with breakfast.  ondansetron 4 MG tablet Commonly known as: Zofran Take 1 tablet (4 mg total) by mouth every 8 (eight) hours as needed for nausea or vomiting.   oxyCODONE-acetaminophen 10-325 MG tablet Commonly known as: Percocet Take 1 tablet by mouth every 6 (six) hours as needed for up to 5 days for pain.   Refresh Optive Mega-3 0.5-1-0.5 % Soln Generic drug: Carboxymeth-Glyc-Polysorb PF Place 1 drop into both eyes daily.   tamsulosin 0.4 MG Caps capsule Commonly known as:  FLOMAX Take 0.4 mg by mouth at bedtime.   vitamin C 1000 MG tablet Take 3,000 mg by mouth in the morning and at bedtime.        Diagnostic Studies: DG Chest 2 View  Result Date: 10/05/2020 CLINICAL DATA:  Preop cervical fusion EXAM: CHEST - 2 VIEW COMPARISON:  None. FINDINGS: The heart size and mediastinal contours are within normal limits. Both lungs are clear. The visualized skeletal structures are unremarkable. IMPRESSION: Normal study. Electronically Signed   By: Charlett Nose M.D.   On: 10/05/2020 09:26   DG Cervical Spine 2 or 3 views  Result Date: 10/08/2020 CLINICAL DATA:  Surgery, elective. Additional history provided: ACDF C3-C6. Provided fluoroscopy time 1 minutes, 17 seconds (6.42 mGy). EXAM: CERVICAL SPINE - 2-3 VIEW; DG C-ARM 1-60 MIN COMPARISON:  No pertinent prior exams available for comparison. FINDINGS: PA and lateral view intraoperative fluoroscopic images of the cervical spine are submitted, 3 images total. The provided images demonstrate ACDF hardware at the C3-C4, C4-C5 and C5-C6 levels. Partially visualized ET tube. IMPRESSION: Three intraoperative fluoroscopic images of the cervical spine from C3-C6 ACDF, as described. Electronically Signed   By: Jackey Loge DO   On: 10/08/2020 11:53   DG C-Arm 1-60 Min  Result Date: 10/08/2020 CLINICAL DATA:  Surgery, elective. Additional history provided: ACDF C3-C6. Provided fluoroscopy time 1 minutes, 17 seconds (6.42 mGy). EXAM: CERVICAL SPINE - 2-3 VIEW; DG C-ARM 1-60 MIN COMPARISON:  No pertinent prior exams available for comparison. FINDINGS: PA and lateral view intraoperative fluoroscopic images of the cervical spine are submitted, 3 images total. The provided images demonstrate ACDF hardware at the C3-C4, C4-C5 and C5-C6 levels. Partially visualized ET tube. IMPRESSION: Three intraoperative fluoroscopic images of the cervical spine from C3-C6 ACDF, as described. Electronically Signed   By: Jackey Loge DO   On: 10/08/2020 11:53     Discharge Instructions     Incentive spirometry RT   Complete by: As directed         Follow-up Information     Venita Lick, MD Follow up in 2 week(s).   Specialty: Orthopedic Surgery Why: If symptoms worsen, For suture removal, For wound re-check Contact information: [address] [place] [address] [phone]                 Discharge Plan:  discharge to home  Disposition: stable    Signed: Rhodia Albright for [facility] PA-C [facility] [phone] 10/12/2020, 10:42 AM

## 2020-10-12 NOTE — ED Notes (Signed)
Patient states "It is time for me to go" RN made MD aware. Patient deferred pain medication earlier today but reports he is uncomfortable.

## 2020-10-12 NOTE — ED Triage Notes (Signed)
Pt had a abd pain cold sweat then gave self fleets enema. ? Passed out while setting on toilet. States he is not back in his usual routine. Not eating as well due to surgery and neck brace.

## 2020-10-12 NOTE — ED Notes (Signed)
MD Haviland at bedside.  

## 2022-06-09 ENCOUNTER — Other Ambulatory Visit: Payer: Self-pay | Admitting: Urology

## 2022-06-09 DIAGNOSIS — R972 Elevated prostate specific antigen [PSA]: Secondary | ICD-10-CM

## 2022-07-08 ENCOUNTER — Ambulatory Visit
Admission: RE | Admit: 2022-07-08 | Discharge: 2022-07-08 | Disposition: A | Discharge status: Home / Self Care | Payer: 59 | Source: Ambulatory Visit | Attending: Urology | Admitting: Urology

## 2022-07-08 DIAGNOSIS — R972 Elevated prostate specific antigen [PSA]: Secondary | ICD-10-CM

## 2022-07-08 MED ORDER — GADOPICLENOL 0.5 MMOL/ML IV SOLN
9.0000 mL | Freq: Once | INTRAVENOUS | Status: AC | PRN
  Administered 2022-07-08: 9 mL via INTRAVENOUS

## 2022-11-06 IMAGING — CR DG CHEST 2V
2 series · 2 of 2 positions shown · non-contrast
Comparison: None.

CLINICAL DATA: Preop cervical fusion

EXAM:
CHEST - 2 VIEW

[w chest pa]
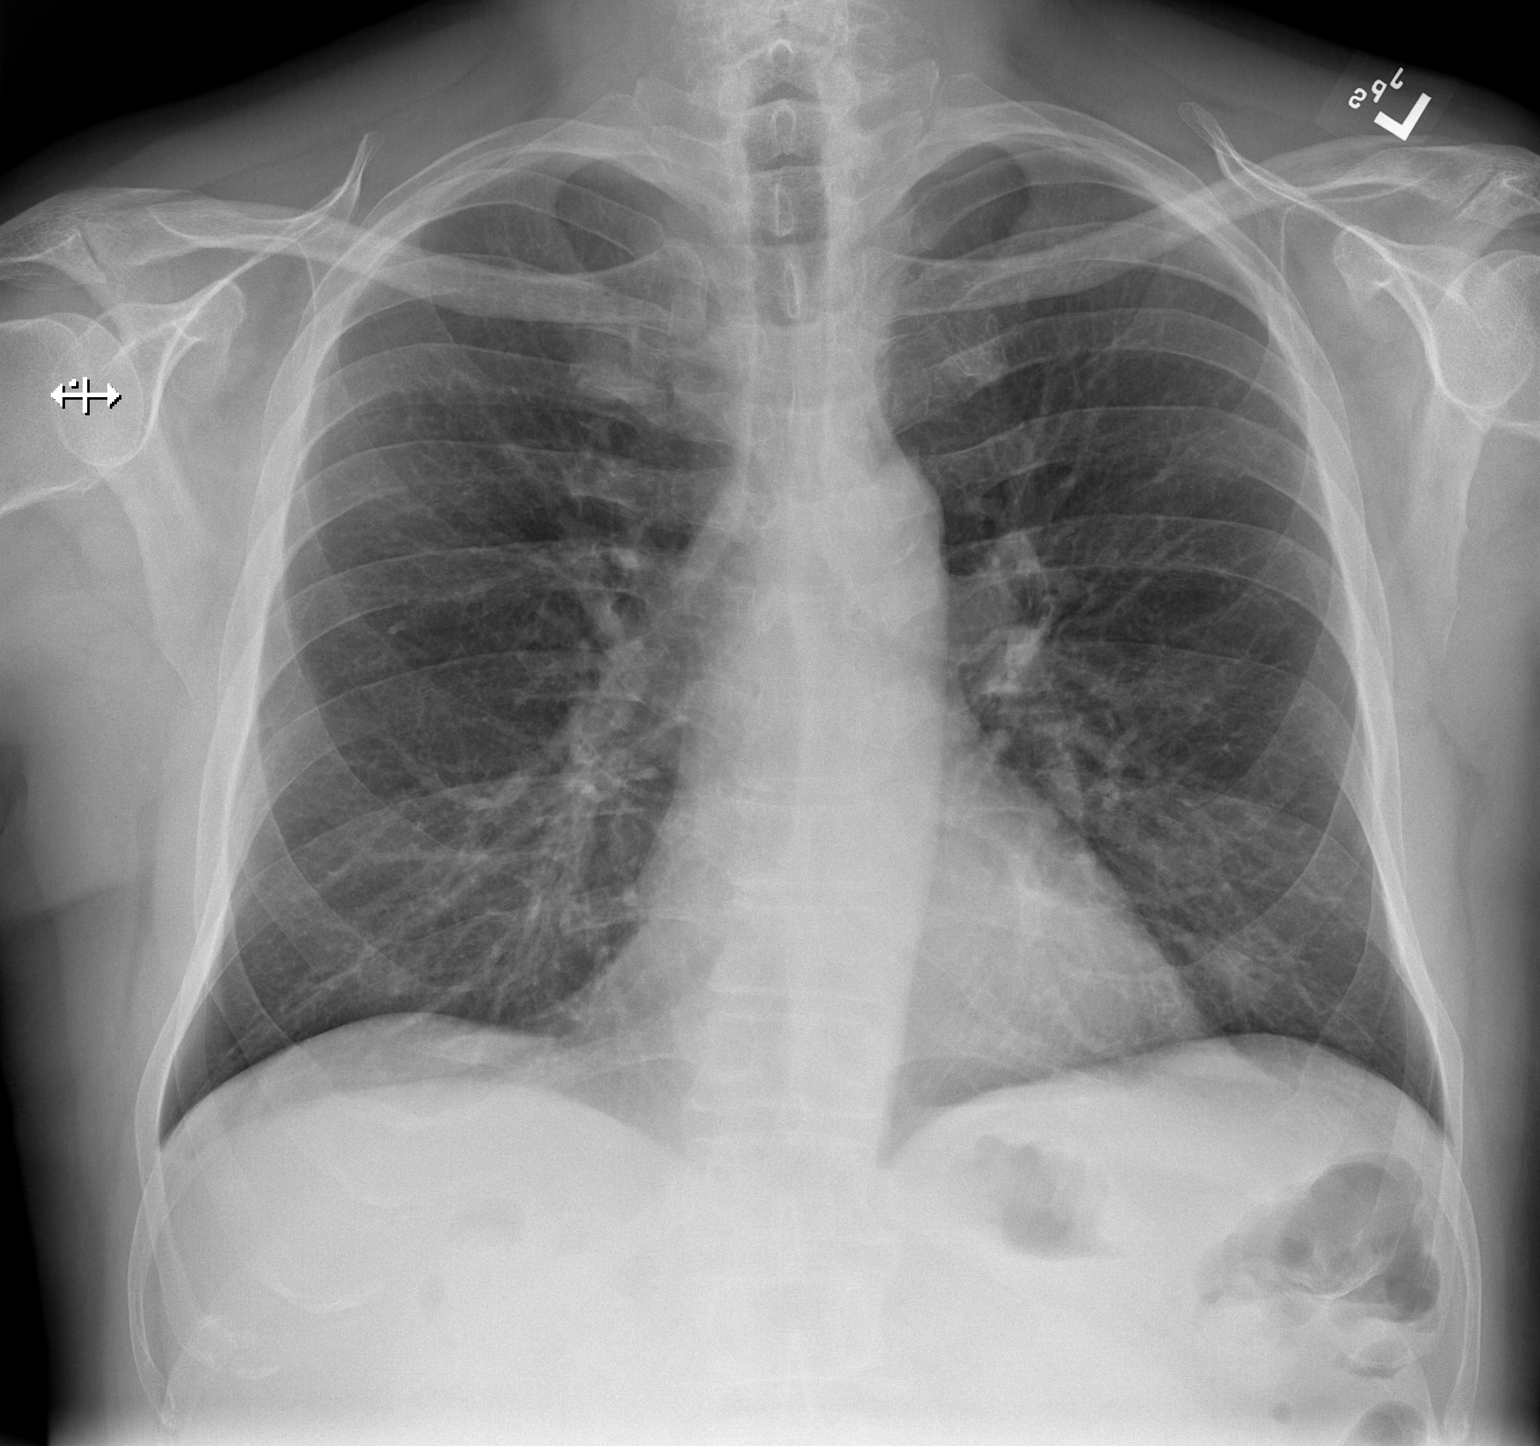

[w chest lat]
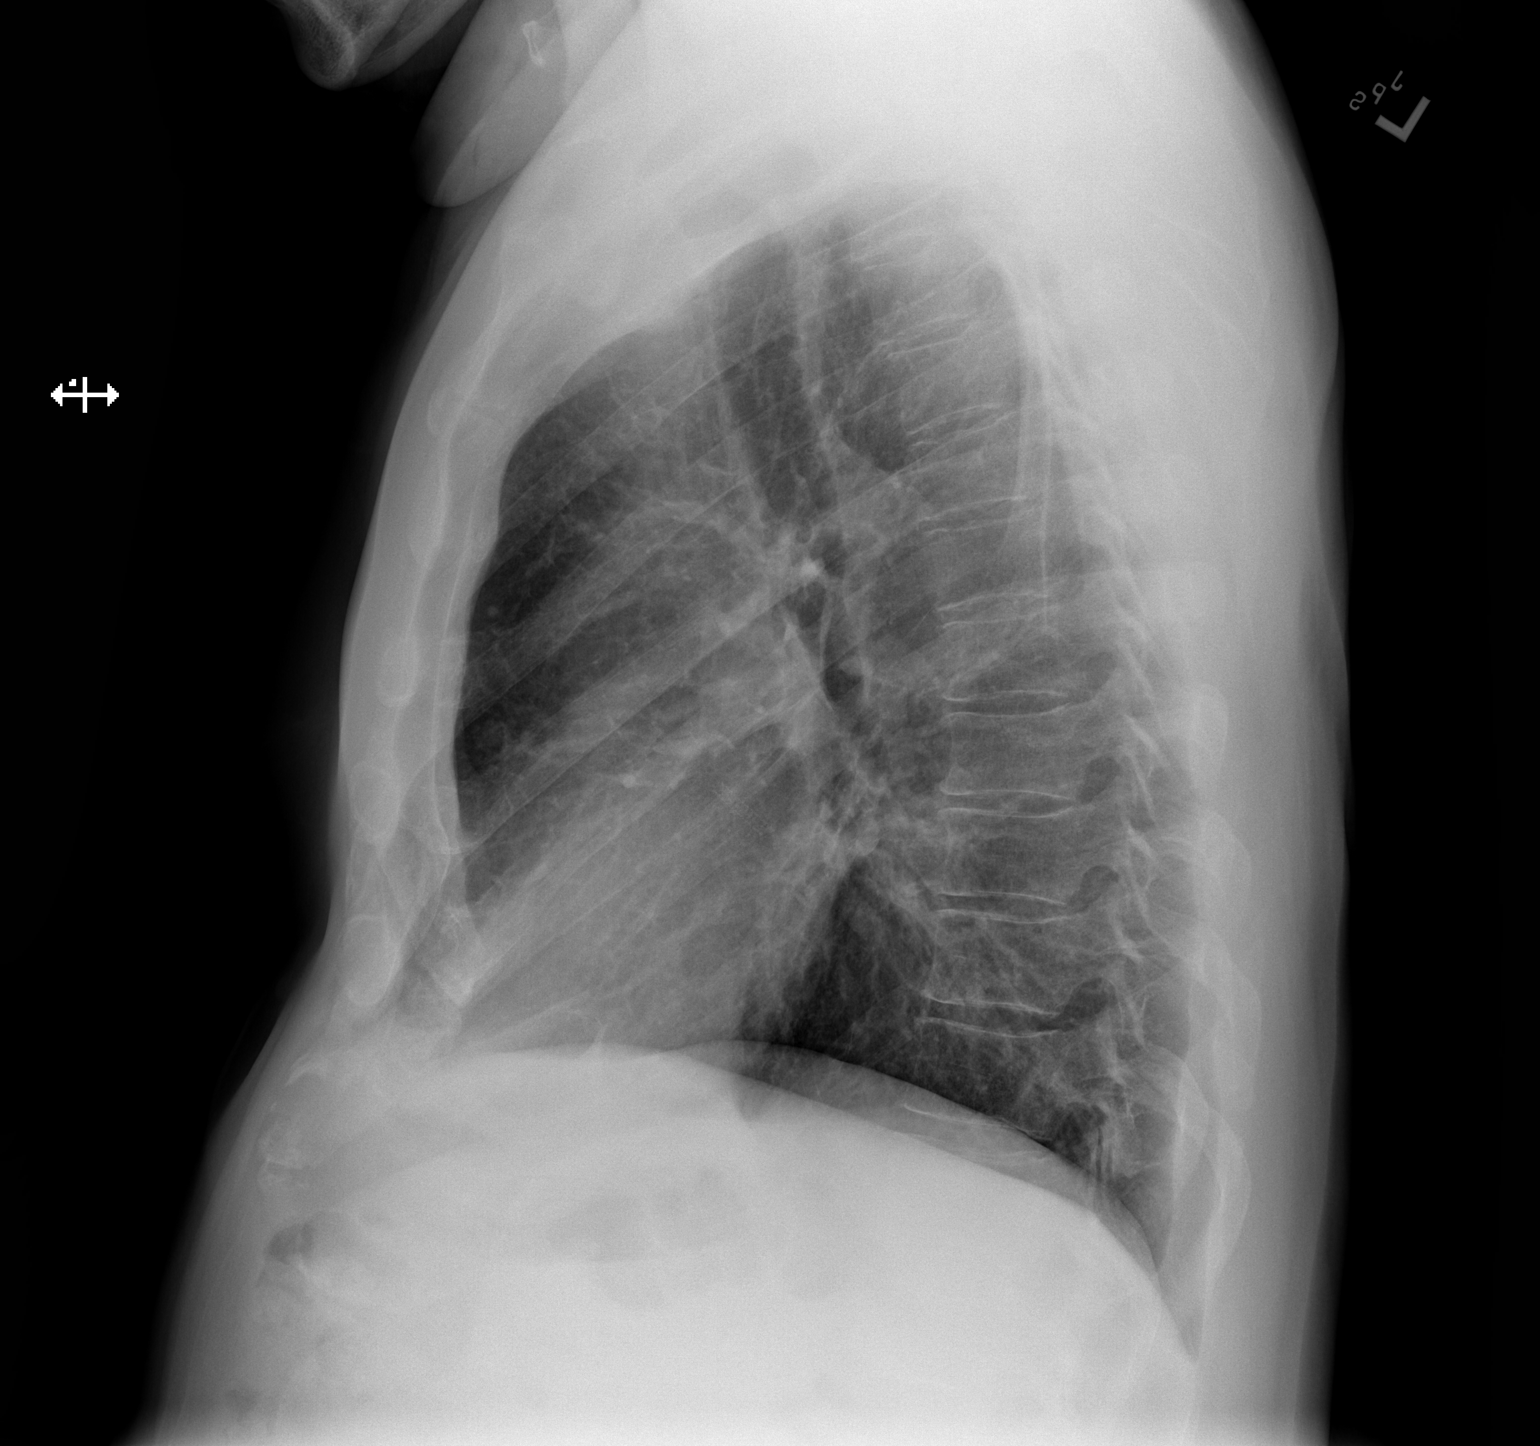

[2 of 2 positions shown; findings below may reference images not displayed]

FINDINGS: The heart size and mediastinal contours are within normal limits.
Both lungs are clear. The visualized skeletal structures are
unremarkable.
IMPRESSION: Normal study.

## 2022-11-09 IMAGING — RF DG CERVICAL SPINE 2 OR 3 VIEWS
1 series · 3 of 3 positions shown · non-contrast
Comparison: No pertinent prior exams available for comparison.

CLINICAL DATA: Surgery, elective. Additional history provided: ACDF
C3-C6. Provided fluoroscopy time 1 minutes, 17 seconds (6.42 mGy).

EXAM:
CERVICAL SPINE - 2-3 VIEW; DG C-ARM 1-60 MIN

[Series 1: run · 3 of 3 slices shown]
[im 1/3]
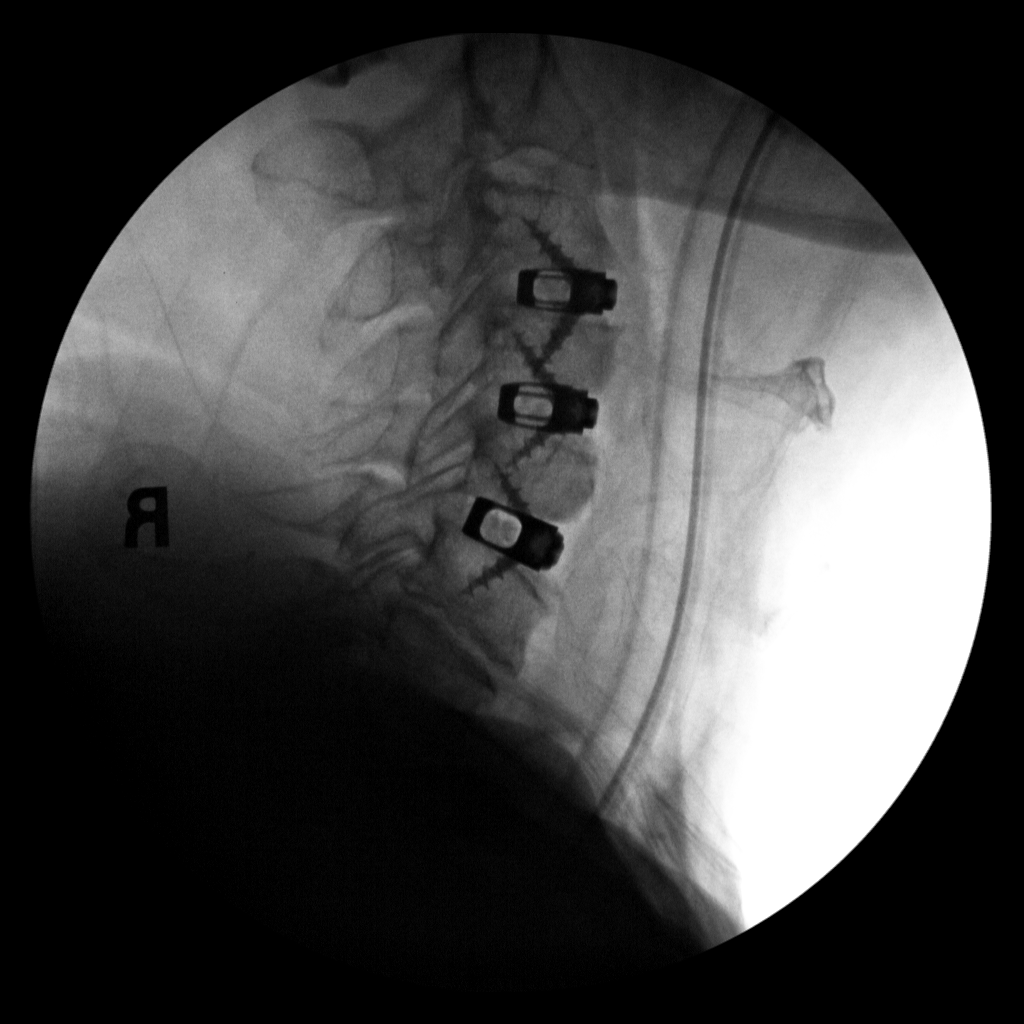
[im 2/3]
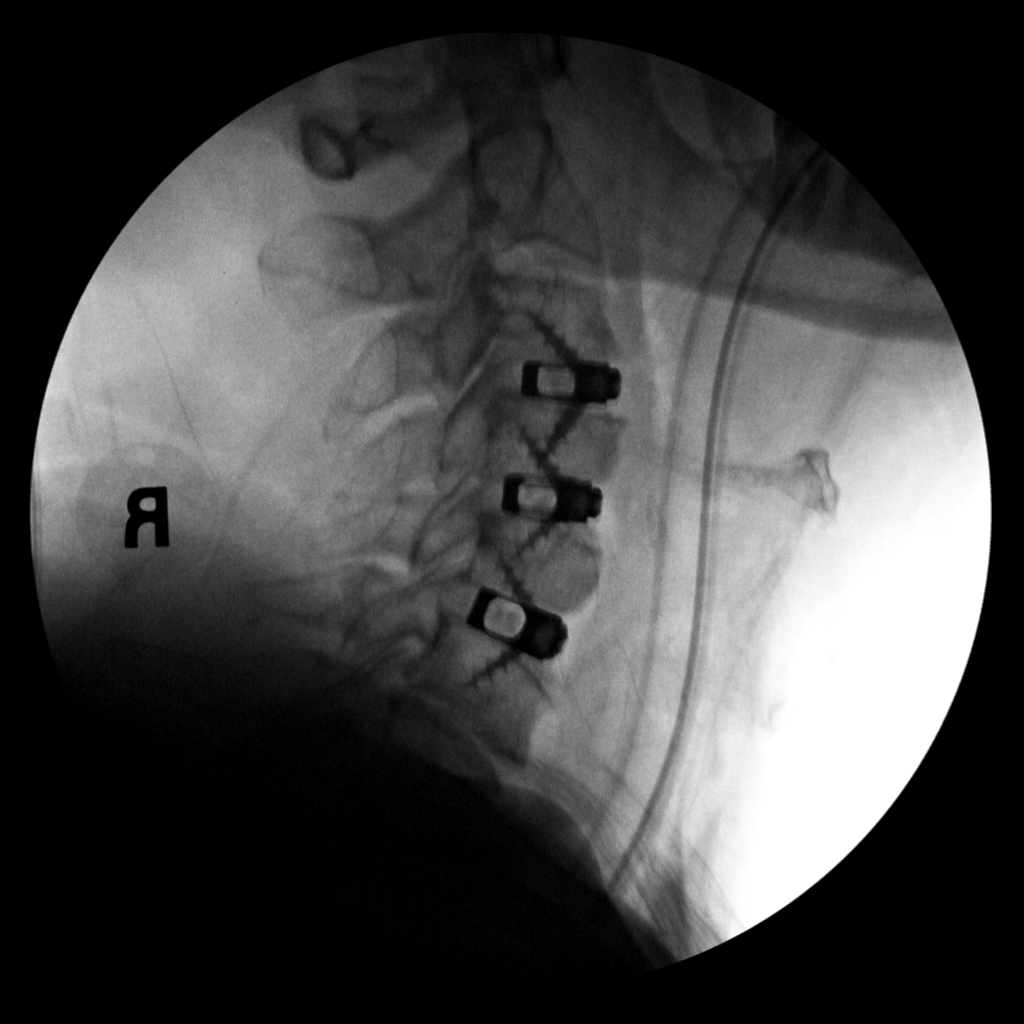
[im 3/3]
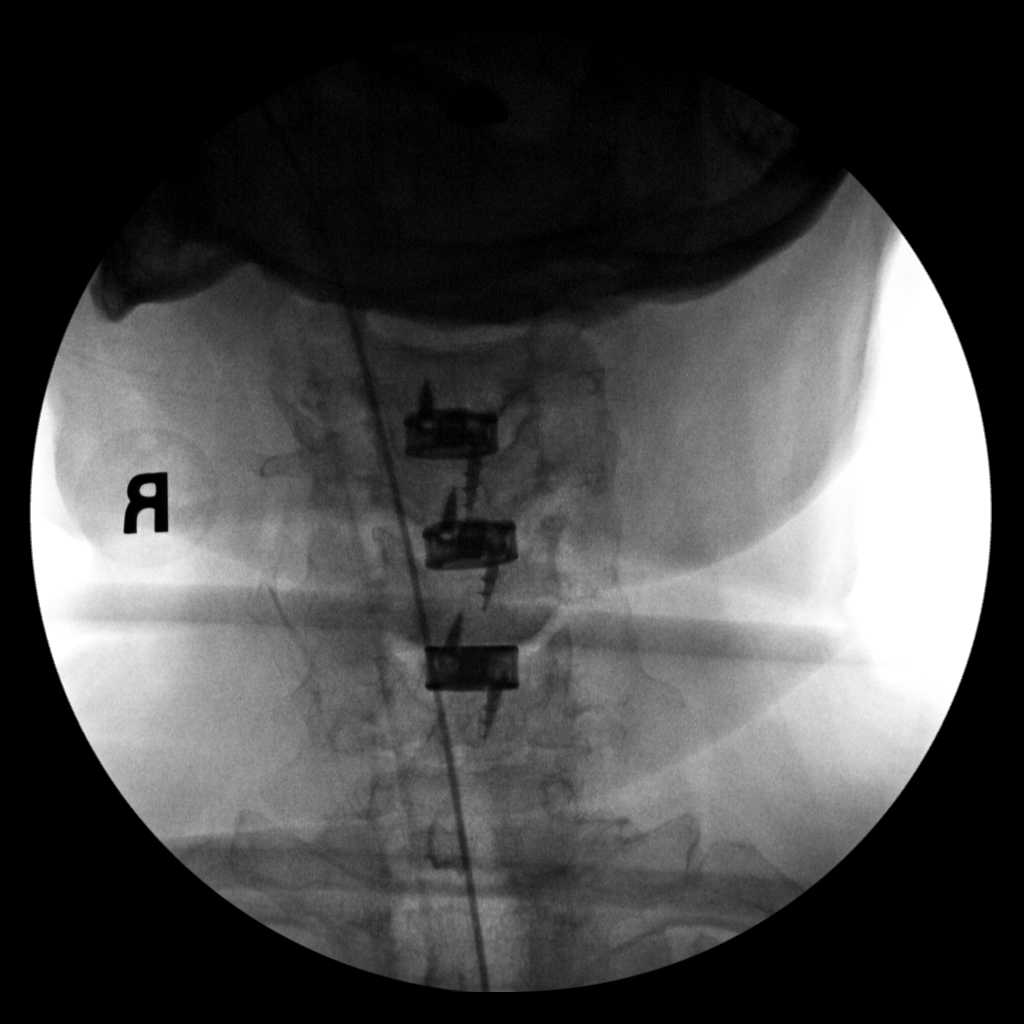

[3 of 3 positions shown; findings below may reference images not displayed]

FINDINGS: PA and lateral view intraoperative fluoroscopic images of the
cervical spine are submitted, 3 images total. The provided images
demonstrate ACDF hardware at the C3-C4, C4-C5 and C5-C6 levels.
Partially visualized ET tube.
IMPRESSION: Three intraoperative fluoroscopic images of the cervical spine from
C3-C6 ACDF, as described.

## 2023-05-10 DIAGNOSIS — I2510 Atherosclerotic heart disease of native coronary artery without angina pectoris: Secondary | ICD-10-CM | POA: Diagnosis not present

## 2023-05-10 DIAGNOSIS — I252 Old myocardial infarction: Secondary | ICD-10-CM | POA: Diagnosis not present

## 2023-05-10 DIAGNOSIS — Z955 Presence of coronary angioplasty implant and graft: Secondary | ICD-10-CM | POA: Diagnosis not present

## 2023-05-10 DIAGNOSIS — Z789 Other specified health status: Secondary | ICD-10-CM | POA: Diagnosis not present

## 2023-05-10 DIAGNOSIS — E785 Hyperlipidemia, unspecified: Secondary | ICD-10-CM | POA: Diagnosis not present

## 2023-05-10 DIAGNOSIS — R5381 Other malaise: Secondary | ICD-10-CM | POA: Diagnosis not present

## 2023-05-10 DIAGNOSIS — R5383 Other fatigue: Secondary | ICD-10-CM | POA: Diagnosis not present

## 2023-05-10 DIAGNOSIS — R072 Precordial pain: Secondary | ICD-10-CM | POA: Diagnosis not present

## 2023-06-23 DIAGNOSIS — R072 Precordial pain: Secondary | ICD-10-CM | POA: Diagnosis not present

## 2023-08-14 DIAGNOSIS — E785 Hyperlipidemia, unspecified: Secondary | ICD-10-CM | POA: Diagnosis not present

## 2023-08-14 DIAGNOSIS — Z79899 Other long term (current) drug therapy: Secondary | ICD-10-CM | POA: Diagnosis not present

## 2023-08-17 DIAGNOSIS — M1712 Unilateral primary osteoarthritis, left knee: Secondary | ICD-10-CM | POA: Diagnosis not present

## 2023-11-06 DIAGNOSIS — I25118 Atherosclerotic heart disease of native coronary artery with other forms of angina pectoris: Secondary | ICD-10-CM | POA: Diagnosis not present

## 2023-11-06 DIAGNOSIS — I25110 Atherosclerotic heart disease of native coronary artery with unstable angina pectoris: Secondary | ICD-10-CM | POA: Diagnosis not present

## 2023-11-06 DIAGNOSIS — I10 Essential (primary) hypertension: Secondary | ICD-10-CM | POA: Diagnosis not present

## 2023-11-06 DIAGNOSIS — E782 Mixed hyperlipidemia: Secondary | ICD-10-CM | POA: Diagnosis not present

## 2023-11-06 DIAGNOSIS — R0602 Shortness of breath: Secondary | ICD-10-CM | POA: Diagnosis not present

## 2023-11-06 DIAGNOSIS — I200 Unstable angina: Secondary | ICD-10-CM | POA: Diagnosis not present

## 2023-11-06 DIAGNOSIS — R001 Bradycardia, unspecified: Secondary | ICD-10-CM | POA: Diagnosis not present

## 2023-11-06 DIAGNOSIS — I2089 Other forms of angina pectoris: Secondary | ICD-10-CM | POA: Diagnosis not present

## 2023-11-06 DIAGNOSIS — Z7982 Long term (current) use of aspirin: Secondary | ICD-10-CM | POA: Diagnosis not present

## 2023-11-06 DIAGNOSIS — R079 Chest pain, unspecified: Secondary | ICD-10-CM | POA: Diagnosis not present

## 2023-11-06 DIAGNOSIS — Z87891 Personal history of nicotine dependence: Secondary | ICD-10-CM | POA: Diagnosis not present

## 2023-11-06 DIAGNOSIS — R42 Dizziness and giddiness: Secondary | ICD-10-CM | POA: Diagnosis not present

## 2023-11-13 DIAGNOSIS — I252 Old myocardial infarction: Secondary | ICD-10-CM | POA: Diagnosis not present

## 2023-11-13 DIAGNOSIS — I2510 Atherosclerotic heart disease of native coronary artery without angina pectoris: Secondary | ICD-10-CM | POA: Diagnosis not present

## 2023-11-13 DIAGNOSIS — Z87891 Personal history of nicotine dependence: Secondary | ICD-10-CM | POA: Diagnosis not present

## 2023-11-13 DIAGNOSIS — Z955 Presence of coronary angioplasty implant and graft: Secondary | ICD-10-CM | POA: Diagnosis not present

## 2023-11-13 DIAGNOSIS — E785 Hyperlipidemia, unspecified: Secondary | ICD-10-CM | POA: Diagnosis not present

## 2023-11-13 DIAGNOSIS — R0609 Other forms of dyspnea: Secondary | ICD-10-CM | POA: Diagnosis not present

## 2023-12-05 DIAGNOSIS — Z79899 Other long term (current) drug therapy: Secondary | ICD-10-CM | POA: Diagnosis not present

## 2023-12-05 DIAGNOSIS — I10 Essential (primary) hypertension: Secondary | ICD-10-CM | POA: Diagnosis not present

## 2023-12-05 DIAGNOSIS — I2510 Atherosclerotic heart disease of native coronary artery without angina pectoris: Secondary | ICD-10-CM | POA: Diagnosis not present

## 2023-12-05 DIAGNOSIS — E785 Hyperlipidemia, unspecified: Secondary | ICD-10-CM | POA: Diagnosis not present

## 2023-12-05 DIAGNOSIS — Z7982 Long term (current) use of aspirin: Secondary | ICD-10-CM | POA: Diagnosis not present

## 2023-12-05 DIAGNOSIS — Z955 Presence of coronary angioplasty implant and graft: Secondary | ICD-10-CM | POA: Diagnosis not present

## 2023-12-05 DIAGNOSIS — Z87891 Personal history of nicotine dependence: Secondary | ICD-10-CM | POA: Diagnosis not present

## 2023-12-11 DIAGNOSIS — I10 Essential (primary) hypertension: Secondary | ICD-10-CM | POA: Diagnosis not present

## 2023-12-11 DIAGNOSIS — Z87891 Personal history of nicotine dependence: Secondary | ICD-10-CM | POA: Diagnosis not present

## 2023-12-11 DIAGNOSIS — Z79899 Other long term (current) drug therapy: Secondary | ICD-10-CM | POA: Diagnosis not present

## 2023-12-11 DIAGNOSIS — E785 Hyperlipidemia, unspecified: Secondary | ICD-10-CM | POA: Diagnosis not present

## 2023-12-11 DIAGNOSIS — Z955 Presence of coronary angioplasty implant and graft: Secondary | ICD-10-CM | POA: Diagnosis not present

## 2023-12-11 DIAGNOSIS — Z7982 Long term (current) use of aspirin: Secondary | ICD-10-CM | POA: Diagnosis not present

## 2023-12-11 DIAGNOSIS — I2510 Atherosclerotic heart disease of native coronary artery without angina pectoris: Secondary | ICD-10-CM | POA: Diagnosis not present

## 2023-12-13 DIAGNOSIS — Z87891 Personal history of nicotine dependence: Secondary | ICD-10-CM | POA: Diagnosis not present

## 2023-12-13 DIAGNOSIS — Z79899 Other long term (current) drug therapy: Secondary | ICD-10-CM | POA: Diagnosis not present

## 2023-12-13 DIAGNOSIS — Z955 Presence of coronary angioplasty implant and graft: Secondary | ICD-10-CM | POA: Diagnosis not present

## 2023-12-13 DIAGNOSIS — I10 Essential (primary) hypertension: Secondary | ICD-10-CM | POA: Diagnosis not present

## 2023-12-13 DIAGNOSIS — I2510 Atherosclerotic heart disease of native coronary artery without angina pectoris: Secondary | ICD-10-CM | POA: Diagnosis not present

## 2023-12-13 DIAGNOSIS — E785 Hyperlipidemia, unspecified: Secondary | ICD-10-CM | POA: Diagnosis not present

## 2023-12-13 DIAGNOSIS — Z7982 Long term (current) use of aspirin: Secondary | ICD-10-CM | POA: Diagnosis not present

## 2023-12-15 DIAGNOSIS — Z79899 Other long term (current) drug therapy: Secondary | ICD-10-CM | POA: Diagnosis not present

## 2023-12-15 DIAGNOSIS — Z87891 Personal history of nicotine dependence: Secondary | ICD-10-CM | POA: Diagnosis not present

## 2023-12-15 DIAGNOSIS — Z955 Presence of coronary angioplasty implant and graft: Secondary | ICD-10-CM | POA: Diagnosis not present

## 2023-12-15 DIAGNOSIS — E785 Hyperlipidemia, unspecified: Secondary | ICD-10-CM | POA: Diagnosis not present

## 2023-12-15 DIAGNOSIS — I2510 Atherosclerotic heart disease of native coronary artery without angina pectoris: Secondary | ICD-10-CM | POA: Diagnosis not present

## 2023-12-15 DIAGNOSIS — Z7982 Long term (current) use of aspirin: Secondary | ICD-10-CM | POA: Diagnosis not present

## 2023-12-15 DIAGNOSIS — I10 Essential (primary) hypertension: Secondary | ICD-10-CM | POA: Diagnosis not present

## 2023-12-18 DIAGNOSIS — T466X5A Adverse effect of antihyperlipidemic and antiarteriosclerotic drugs, initial encounter: Secondary | ICD-10-CM | POA: Diagnosis not present

## 2023-12-18 DIAGNOSIS — Z7982 Long term (current) use of aspirin: Secondary | ICD-10-CM | POA: Diagnosis not present

## 2023-12-18 DIAGNOSIS — I2510 Atherosclerotic heart disease of native coronary artery without angina pectoris: Secondary | ICD-10-CM | POA: Diagnosis not present

## 2023-12-18 DIAGNOSIS — Z955 Presence of coronary angioplasty implant and graft: Secondary | ICD-10-CM | POA: Diagnosis not present

## 2023-12-18 DIAGNOSIS — E785 Hyperlipidemia, unspecified: Secondary | ICD-10-CM | POA: Diagnosis not present

## 2023-12-18 DIAGNOSIS — I10 Essential (primary) hypertension: Secondary | ICD-10-CM | POA: Diagnosis not present

## 2023-12-18 DIAGNOSIS — I25119 Atherosclerotic heart disease of native coronary artery with unspecified angina pectoris: Secondary | ICD-10-CM | POA: Diagnosis not present

## 2023-12-18 DIAGNOSIS — Z79899 Other long term (current) drug therapy: Secondary | ICD-10-CM | POA: Diagnosis not present

## 2023-12-18 DIAGNOSIS — S8011XA Contusion of right lower leg, initial encounter: Secondary | ICD-10-CM | POA: Diagnosis not present

## 2023-12-18 DIAGNOSIS — Z87891 Personal history of nicotine dependence: Secondary | ICD-10-CM | POA: Diagnosis not present

## 2023-12-18 DIAGNOSIS — S99911A Unspecified injury of right ankle, initial encounter: Secondary | ICD-10-CM | POA: Diagnosis not present

## 2023-12-18 DIAGNOSIS — G720 Drug-induced myopathy: Secondary | ICD-10-CM | POA: Diagnosis not present

## 2024-01-02 DIAGNOSIS — Z131 Encounter for screening for diabetes mellitus: Secondary | ICD-10-CM | POA: Diagnosis not present

## 2024-01-02 DIAGNOSIS — E785 Hyperlipidemia, unspecified: Secondary | ICD-10-CM | POA: Diagnosis not present

## 2024-01-16 DIAGNOSIS — G720 Drug-induced myopathy: Secondary | ICD-10-CM | POA: Diagnosis not present

## 2024-01-16 DIAGNOSIS — J4520 Mild intermittent asthma, uncomplicated: Secondary | ICD-10-CM | POA: Diagnosis not present

## 2024-01-16 DIAGNOSIS — Z955 Presence of coronary angioplasty implant and graft: Secondary | ICD-10-CM | POA: Diagnosis not present

## 2024-01-16 DIAGNOSIS — I25119 Atherosclerotic heart disease of native coronary artery with unspecified angina pectoris: Secondary | ICD-10-CM | POA: Diagnosis not present

## 2024-01-16 DIAGNOSIS — Z0000 Encounter for general adult medical examination without abnormal findings: Secondary | ICD-10-CM | POA: Diagnosis not present

## 2024-01-16 DIAGNOSIS — N138 Other obstructive and reflux uropathy: Secondary | ICD-10-CM | POA: Diagnosis not present

## 2024-01-16 DIAGNOSIS — E785 Hyperlipidemia, unspecified: Secondary | ICD-10-CM | POA: Diagnosis not present

## 2024-01-16 DIAGNOSIS — T466X5A Adverse effect of antihyperlipidemic and antiarteriosclerotic drugs, initial encounter: Secondary | ICD-10-CM | POA: Diagnosis not present

## 2024-01-16 DIAGNOSIS — Z9181 History of falling: Secondary | ICD-10-CM | POA: Diagnosis not present

## 2024-01-17 DIAGNOSIS — R5381 Other malaise: Secondary | ICD-10-CM | POA: Diagnosis not present

## 2024-01-17 DIAGNOSIS — I2510 Atherosclerotic heart disease of native coronary artery without angina pectoris: Secondary | ICD-10-CM | POA: Diagnosis not present

## 2024-01-17 DIAGNOSIS — Z87891 Personal history of nicotine dependence: Secondary | ICD-10-CM | POA: Diagnosis not present

## 2024-01-17 DIAGNOSIS — I252 Old myocardial infarction: Secondary | ICD-10-CM | POA: Diagnosis not present

## 2024-01-17 DIAGNOSIS — Z789 Other specified health status: Secondary | ICD-10-CM | POA: Diagnosis not present

## 2024-01-17 DIAGNOSIS — E782 Mixed hyperlipidemia: Secondary | ICD-10-CM | POA: Diagnosis not present

## 2024-01-17 DIAGNOSIS — Z955 Presence of coronary angioplasty implant and graft: Secondary | ICD-10-CM | POA: Diagnosis not present

## 2024-01-17 DIAGNOSIS — R5383 Other fatigue: Secondary | ICD-10-CM | POA: Diagnosis not present
